# Patient Record
Sex: Female | Born: 1935 | Race: White | Hispanic: No | Marital: Married | Smoking: Never smoker
Health system: Southern US, Community
[De-identification: ages and names within clinical notes are randomized; demographics above are authoritative.]

## PROBLEM LIST (undated history)

## (undated) DIAGNOSIS — I10 Essential (primary) hypertension: Secondary | ICD-10-CM

## (undated) DIAGNOSIS — Z950 Presence of cardiac pacemaker: Secondary | ICD-10-CM

## (undated) HISTORY — PX: ABDOMINAL HYSTERECTOMY: SHX81

## (undated) HISTORY — DX: Essential (primary) hypertension: I10

## (undated) HISTORY — DX: Presence of cardiac pacemaker: Z95.0

## (undated) HISTORY — PX: BREAST SURGERY: SHX581

---

## 2010-11-20 IMAGING — US US EXTREM LOW VENOUS*R*
1 series · 17 of 24 positions shown · non-contrast
Comparison: none

REASON FOR EXAM: swollen right leg    eval DVT  call report [phone]
COMMENTS:

[Series 1: us extrem low venous*right* · 17 of 32 slices shown]
[im 1/32]
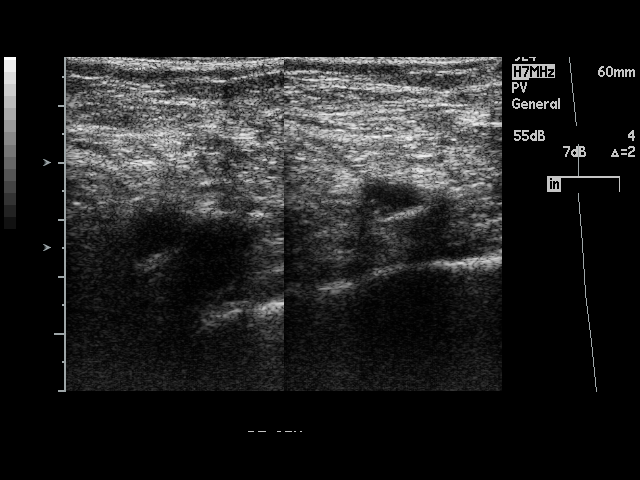
[im 3/32]
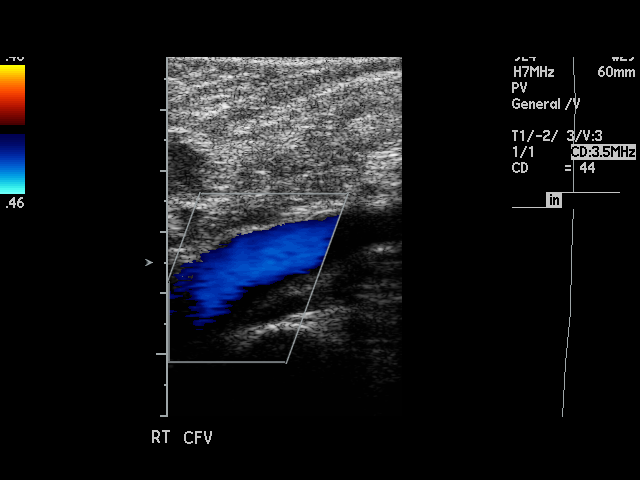
[im 5/32]
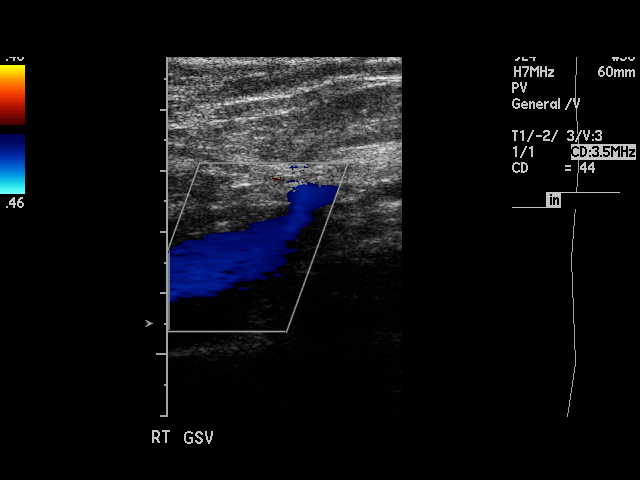
[im 6/32]
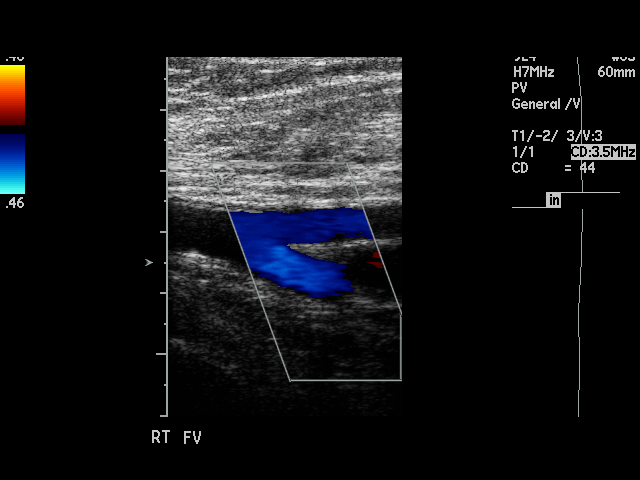
[im 9/32]
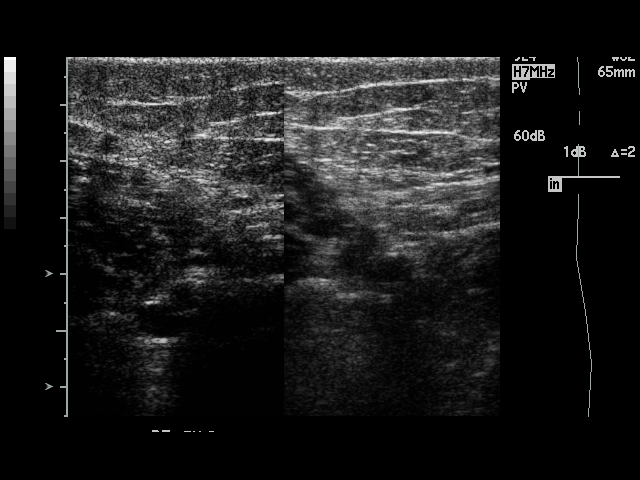
[im 10/32]
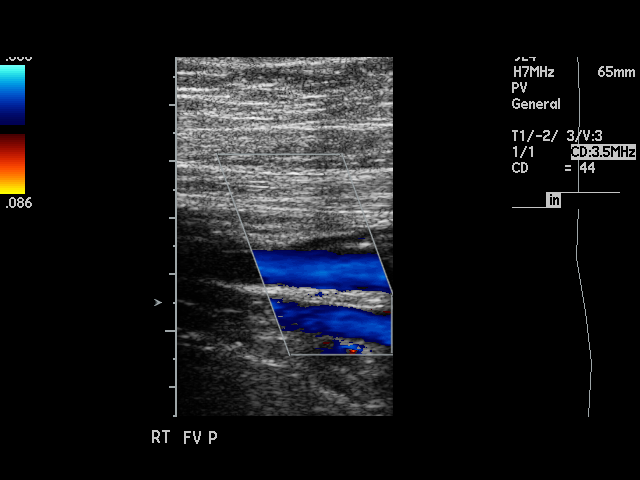
[im 13/32]
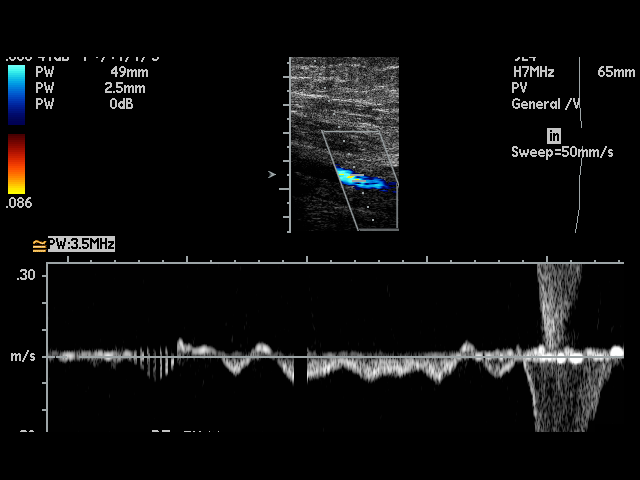
[im 14/32]
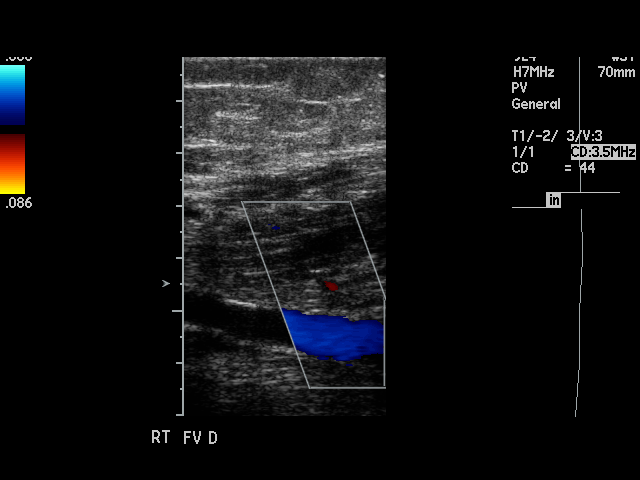
[im 17/32]
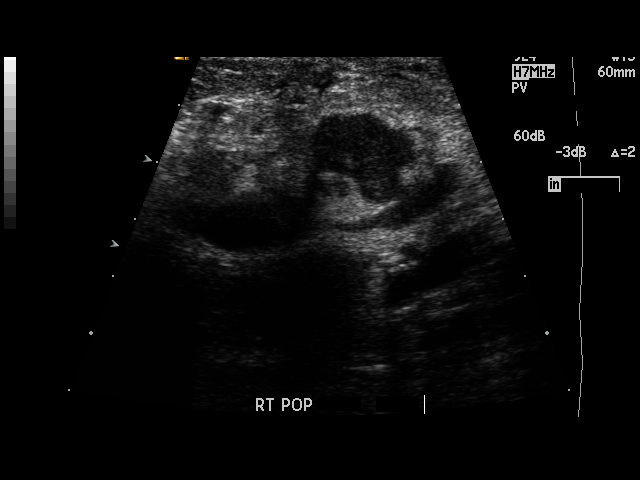
[im 18/32]
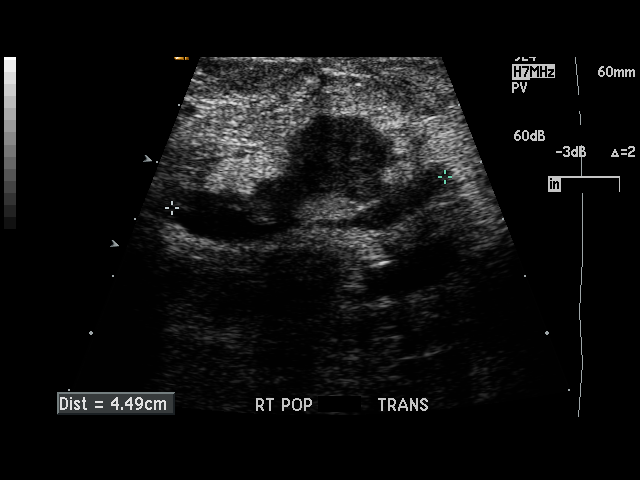
[im 19/32]
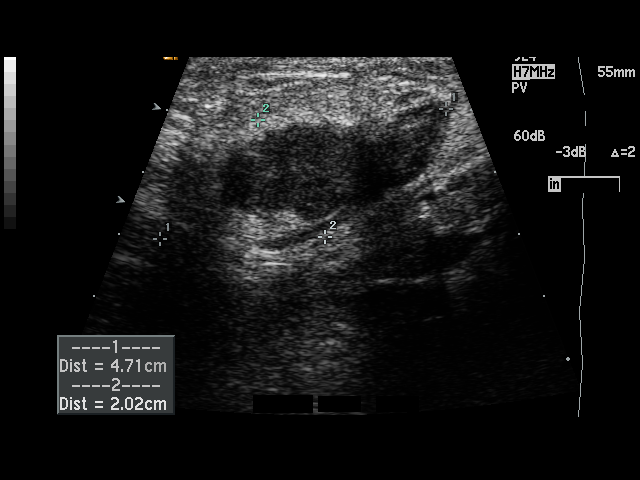
[im 22/32]
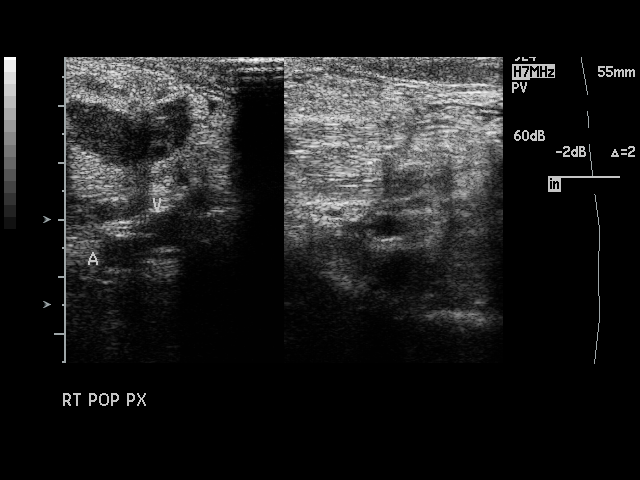
[im 23/32]
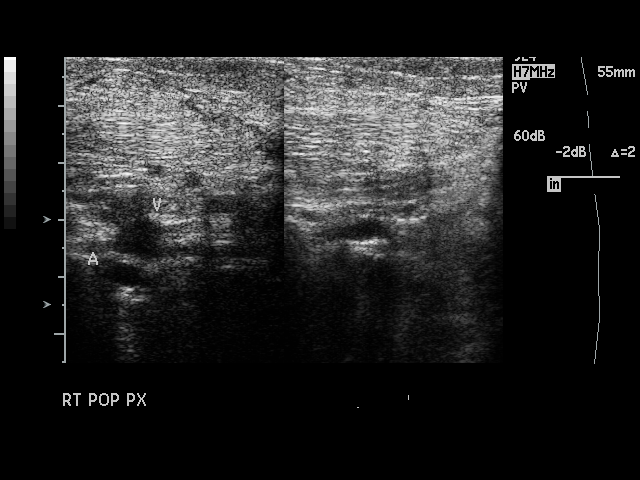
[im 26/32]
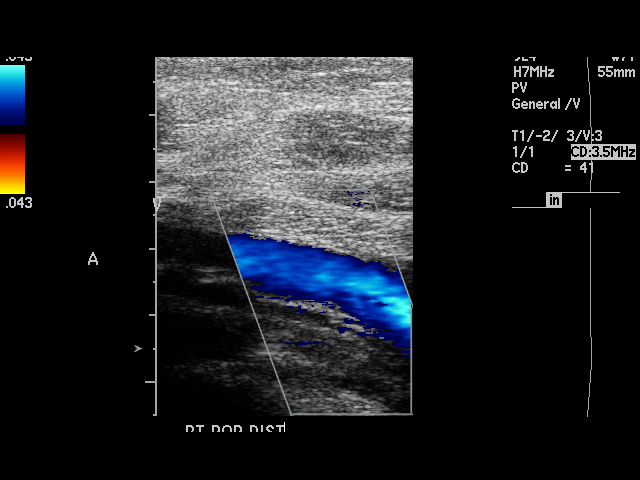
[im 27/32]
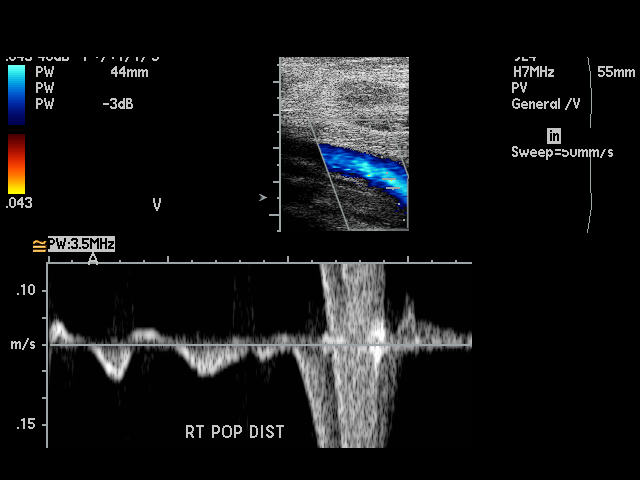
[im 29/32]
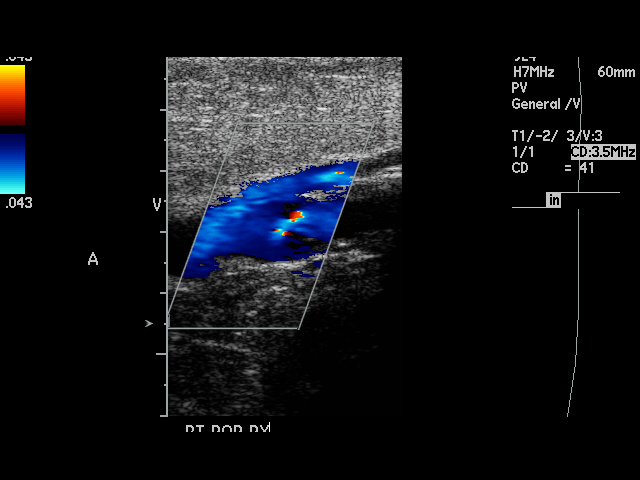
[im 32/32]
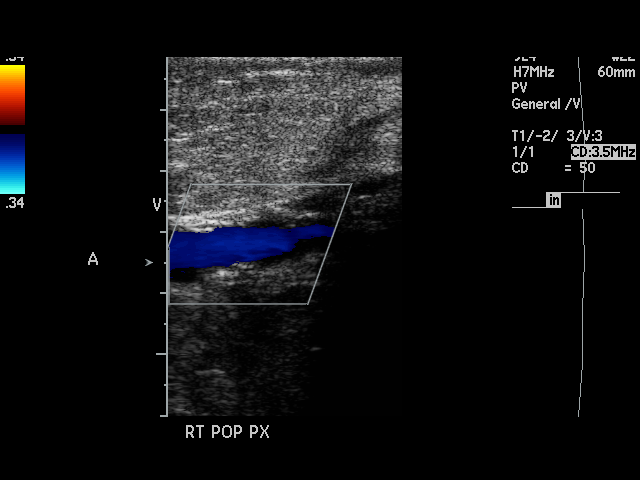

[17 of 24 positions shown; findings below may reference images not displayed]

PROCEDURE:     US  - US DOPPLER LOW EXTR RIGHT  - May 24, 2008  [DATE]

RESULT:     The augmentation flow waveforms are normal. The RIGHT
femoral/popliteal vein shows complete compressibility throughout its course.
Doppler examination shows no occlusion or evidence of the deep venous
thrombosis. There is incidentally noted are a few nonspecific flecks of
calcification in the popliteal vein. There is lucency at the knee
posteriorly compatible with a popliteal cyst. This contains internal echoes
consistent with debris. The popliteal cyst measures 4.49 cm x 4.71 cm x
cm.
IMPRESSION: 1. No deep vein thrombosis is identified.
2. There is a fluid collection posterior to the knee compatible with a
popliteal cyst containing debris.
3. Incidental note is made of a few flecks of calcification in the popliteal
vein.

## 2011-01-22 ENCOUNTER — Other Ambulatory Visit: Payer: Self-pay

## 2011-01-22 ENCOUNTER — Other Ambulatory Visit (INDEPENDENT_AMBULATORY_CARE_PROVIDER_SITE_OTHER): Payer: Medicare Other | Admitting: *Deleted

## 2011-01-22 DIAGNOSIS — Z7901 Long term (current) use of anticoagulants: Secondary | ICD-10-CM

## 2011-01-22 LAB — PROTIME-INR: INR: 4.5 ratio — ABNORMAL HIGH (ref 0.8–1.0)

## 2011-01-24 ENCOUNTER — Other Ambulatory Visit (INDEPENDENT_AMBULATORY_CARE_PROVIDER_SITE_OTHER): Payer: Medicare Other | Admitting: *Deleted

## 2011-01-24 DIAGNOSIS — Z7901 Long term (current) use of anticoagulants: Secondary | ICD-10-CM

## 2011-01-24 NOTE — Progress Notes (Signed)
Addended by: Jobie Quaker on: 01/24/2011 12:09 PM   Modules accepted: Orders

## 2011-01-26 ENCOUNTER — Other Ambulatory Visit: Payer: Self-pay | Admitting: Internal Medicine

## 2011-01-26 DIAGNOSIS — Z9229 Personal history of other drug therapy: Secondary | ICD-10-CM

## 2011-01-26 DIAGNOSIS — I10 Essential (primary) hypertension: Secondary | ICD-10-CM

## 2011-01-26 MED ORDER — WARFARIN SODIUM 5 MG PO TABS: 5.0000 mg | ORAL_TABLET | Freq: Every day | ORAL | Status: DC

## 2011-01-26 MED ORDER — HYDROCHLOROTHIAZIDE 25 MG PO TABS: 25.0000 mg | ORAL_TABLET | Freq: Every day | ORAL | Status: DC

## 2011-01-26 MED ORDER — AMLODIPINE BESYLATE 10 MG PO TABS: 10.0000 mg | ORAL_TABLET | Freq: Every day | ORAL | Status: DC

## 2011-01-30 ENCOUNTER — Ambulatory Visit: Payer: Medicare Other | Admitting: Internal Medicine

## 2011-01-30 LAB — PROTIME-INR
INR: 1.2 ratio — ABNORMAL HIGH (ref 0.8–1.0)
Prothrombin Time: 13.6 s — ABNORMAL HIGH (ref 10.2–12.4)

## 2011-02-01 ENCOUNTER — Encounter: Payer: Self-pay | Admitting: Internal Medicine

## 2011-02-01 ENCOUNTER — Ambulatory Visit (INDEPENDENT_AMBULATORY_CARE_PROVIDER_SITE_OTHER): Payer: Medicare Other | Admitting: Internal Medicine

## 2011-02-01 VITALS — BP 137/67 | HR 77 | Temp 98.2°F | Resp 12 | Ht 62.0 in | Wt 146.0 lb

## 2011-02-01 DIAGNOSIS — Z7901 Long term (current) use of anticoagulants: Secondary | ICD-10-CM

## 2011-02-01 DIAGNOSIS — I10 Essential (primary) hypertension: Secondary | ICD-10-CM

## 2011-02-01 MED ORDER — VALSARTAN 320 MG PO TABS: 320.0000 mg | ORAL_TABLET | Freq: Every day | ORAL | Status: DC

## 2011-02-01 NOTE — Patient Instructions (Signed)
Take 6mg  of coumadin on Monday, Wednesday, Friday, and Sunday. Take 5mg  on Tuesday, Thursday, Saturday. Repeat coumadin level in 1 week.

## 2011-02-01 NOTE — Progress Notes (Signed)
Subjective:    Patient ID: Beverly Powell, female    DOB: 1919-09-21, 77 y.o.   MRN: [id number]  HPI Beverly Powell is an 77 year old female with a history of hypertension and atrial fibrillation on chronic anticoagulation with Coumadin. She presents for followup today. She has been doing very well and denies any complaints today. She denies any easy bleeding or bruising. Her recent INR this week was noted to be low and her dose of Coumadin was increased.  Outpatient Encounter Prescriptions as of 02/01/2011  Medication Sig Dispense Refill  . amLODipine (NORVASC) 10 MG tablet Take 1 tablet (10 mg total) by mouth daily.  30 tablet  11  . aspirin 81 MG tablet Take 81 mg by mouth daily.        . Calcium Carbonate (CALCIUM 600 PO) Take 1 tablet by mouth daily.        . Cholecalciferol (VITAMIN D3) 400 UNITS CAPS Take 1 tablet by mouth daily.        . furosemide (LASIX) 20 MG tablet Take 20 mg by mouth daily.        . hydrochlorothiazide 25 MG tablet Take 1 tablet (25 mg total) by mouth daily.  30 tablet  6  . metoprolol tartrate (LOPRESSOR) 25 MG tablet Take by mouth daily.        Marland Kitchen oxybutynin (DITROPAN-XL) 5 MG 24 hr tablet Take 5 mg by mouth daily.        . valsartan (DIOVAN) 320 MG tablet Take 1 tablet (320 mg total) by mouth daily.  90 tablet  4  . warfarin (COUMADIN) 1 MG tablet Take 1 mg by mouth as directed. Take 6.5mg  on Monday, Wednesday and Friday and sunday       . warfarin (COUMADIN) 5 MG tablet Take 5 mg by mouth. On Tuesday, Thursday and Saturday          Review of Systems  Constitutional: Negative for fever, chills, appetite change, fatigue and unexpected weight change.  HENT: Negative for ear pain, congestion, sore throat, trouble swallowing, neck pain, voice change and sinus pressure.   Eyes: Negative for visual disturbance.  Respiratory: Negative for cough, shortness of breath, wheezing and stridor.   Cardiovascular: Negative for chest pain, palpitations and leg swelling.    Gastrointestinal: Negative for nausea, vomiting, abdominal pain, diarrhea, constipation, blood in stool, abdominal distention and anal bleeding.  Genitourinary: Negative for dysuria and flank pain.  Musculoskeletal: Negative for myalgias, arthralgias and gait problem.  Skin: Negative for color change and rash.  Neurological: Negative for dizziness and headaches.  Hematological: Negative for adenopathy. Does not bruise/bleed easily.  Psychiatric/Behavioral: Negative for suicidal ideas, sleep disturbance and dysphoric mood. The patient is not nervous/anxious.     BP 137/67  Pulse 77  Temp(Src) 98.2 F (36.8 C) (Oral)  Resp 12  Ht 5\' 2"  (1.575 m)  Wt 146 lb (66.225 kg)  BMI 26.70 kg/m2  SpO2 100%     Objective:   Physical Exam  Constitutional: She is oriented to person, place, and time. She appears well-developed and well-nourished. No distress.  HENT:  Head: Normocephalic and atraumatic.  Right Ear: External ear normal.  Left Ear: External ear normal.  Nose: Nose normal.  Mouth/Throat: Oropharynx is clear and moist. No oropharyngeal exudate.  Eyes: Conjunctivae are normal. Pupils are equal, round, and reactive to light. Right eye exhibits no discharge. Left eye exhibits no discharge. No scleral icterus.  Neck: Normal range of motion. Neck supple. No tracheal deviation present. No  thyromegaly present.  Cardiovascular: Normal rate, regular rhythm, normal heart sounds and intact distal pulses.  Exam reveals no gallop and no friction rub.   No murmur heard. Pulmonary/Chest: Effort normal and breath sounds normal. No respiratory distress. She has no wheezes. She has no rales. She exhibits no tenderness.  Musculoskeletal: Normal range of motion. She exhibits no edema and no tenderness.  Lymphadenopathy:    She has no cervical adenopathy.  Neurological: She is alert and oriented to person, place, and time. No cranial nerve deficit. She exhibits normal muscle tone. Coordination normal.   Skin: Skin is warm and dry. No rash noted. She is not diaphoretic. No erythema. No pallor.  Psychiatric: She has a normal mood and affect. Her behavior is normal. Judgment and thought content normal.          Assessment & Plan:  1. Hypertension - patient with hypertension currently on amlodipine, Diovan, furosemide, metoprolol, and hydrochlorothiazide. Blood pressure is well-controlled today. Will recheck renal function with labs next week. She will return to clinic in 3 months.  2. Chronic anticoagulation - patient on Coumadin for anticoagulation recent INR was low at 1.2. Goal INR 2-3. She has increased her Coumadin dose as instructed. We will repeat INR in one week.

## 2011-02-02 ENCOUNTER — Ambulatory Visit: Payer: Medicare Other | Admitting: Internal Medicine

## 2011-02-05 ENCOUNTER — Telehealth: Payer: Self-pay | Admitting: Internal Medicine

## 2011-02-05 NOTE — Telephone Encounter (Signed)
[employer] is requesting a list of patient's current meds, when & why she takes them & ICD code. Please contact patient's daughter

## 2011-02-07 ENCOUNTER — Other Ambulatory Visit (INDEPENDENT_AMBULATORY_CARE_PROVIDER_SITE_OTHER): Payer: Medicare Other | Admitting: *Deleted

## 2011-02-07 DIAGNOSIS — Z7901 Long term (current) use of anticoagulants: Secondary | ICD-10-CM

## 2011-02-07 LAB — PROTIME-INR: Prothrombin Time: 29.1 s — ABNORMAL HIGH (ref 10.2–12.4)

## 2011-02-12 NOTE — Telephone Encounter (Signed)
Printed out medication list and why she takes them and the ICD-9 codes.  Left medication list with Zella Ball for her daughter to pick up.

## 2011-02-14 ENCOUNTER — Telehealth: Payer: Self-pay | Admitting: Internal Medicine

## 2011-02-14 NOTE — Telephone Encounter (Signed)
Has some questions about the form she picked up yesterday.  About her meds

## 2011-02-16 ENCOUNTER — Other Ambulatory Visit: Payer: Self-pay | Admitting: Internal Medicine

## 2011-02-16 ENCOUNTER — Other Ambulatory Visit (INDEPENDENT_AMBULATORY_CARE_PROVIDER_SITE_OTHER): Payer: Medicare Other | Admitting: *Deleted

## 2011-02-16 DIAGNOSIS — Z7901 Long term (current) use of anticoagulants: Secondary | ICD-10-CM

## 2011-02-16 DIAGNOSIS — I10 Essential (primary) hypertension: Secondary | ICD-10-CM

## 2011-02-16 LAB — COMPREHENSIVE METABOLIC PANEL
AST: 26 U/L (ref 0–37)
Alkaline Phosphatase: 45 U/L (ref 39–117)
BUN: 40 mg/dL — ABNORMAL HIGH (ref 6–23)
Creatinine, Ser: 1.5 mg/dL — ABNORMAL HIGH (ref 0.4–1.2)
Glucose, Bld: 85 mg/dL (ref 70–99)

## 2011-02-16 LAB — PROTIME-INR: Prothrombin Time: 39.7 s — ABNORMAL HIGH (ref 10.2–12.4)

## 2011-02-16 LAB — TSH: TSH: 0.93 u[IU]/mL (ref 0.35–5.50)

## 2011-02-16 MED ORDER — FUROSEMIDE 20 MG PO TABS: 20.0000 mg | ORAL_TABLET | Freq: Every day | ORAL | Status: DC

## 2011-02-16 NOTE — Telephone Encounter (Signed)
Ms Joanne Gavel called for her mother.  Ms fadness went by [facility] to pick up med the pharmacy told ms Arabie the rx was denied.  She didn't know who denied it insurance of dr office.  The name of the drugs was oxybutynin er 5mg  tablet Furosemide 20mg    Please advise  Pt when rx is ready

## 2011-02-16 NOTE — Telephone Encounter (Signed)
See below.  Pt daughter called drug store they faxed to wrong dr office. The drug store will be faxing rx to [place].  Pt is out of both meds

## 2011-02-19 ENCOUNTER — Other Ambulatory Visit: Payer: Self-pay | Admitting: *Deleted

## 2011-02-19 MED ORDER — OXYBUTYNIN CHLORIDE ER 5 MG PO TB24: 5.0000 mg | ORAL_TABLET | Freq: Every day | ORAL | Status: DC

## 2011-02-19 NOTE — Telephone Encounter (Signed)
Rx's have been filled

## 2011-02-22 ENCOUNTER — Other Ambulatory Visit (INDEPENDENT_AMBULATORY_CARE_PROVIDER_SITE_OTHER): Payer: Medicare Other | Admitting: *Deleted

## 2011-02-22 DIAGNOSIS — Z7901 Long term (current) use of anticoagulants: Secondary | ICD-10-CM

## 2011-02-22 LAB — PROTIME-INR
INR: 1.6 — ABNORMAL HIGH (ref <1.50)
Prothrombin Time: 19.6 seconds — ABNORMAL HIGH (ref 11.6–15.2)

## 2011-03-05 ENCOUNTER — Other Ambulatory Visit (INDEPENDENT_AMBULATORY_CARE_PROVIDER_SITE_OTHER): Payer: Medicare Other | Admitting: *Deleted

## 2011-03-05 DIAGNOSIS — Z7901 Long term (current) use of anticoagulants: Secondary | ICD-10-CM

## 2011-03-20 ENCOUNTER — Telehealth: Payer: Self-pay | Admitting: Internal Medicine

## 2011-03-20 MED ORDER — OXYBUTYNIN CHLORIDE ER 5 MG PO TB24: 5.0000 mg | ORAL_TABLET | Freq: Every day | ORAL | Status: DC

## 2011-03-20 NOTE — Telephone Encounter (Signed)
I believe that she has completed 5 years of therapy with reclast and Dr. Gavin Potters did not want her to continue with it.  We should get records from [facility] to confirm.

## 2011-03-20 NOTE — Telephone Encounter (Signed)
Patient received a letter from [facility] telling her it is time for another reclast infusion. She was told the last time she received one she would not receive anymore.What should she do?

## 2011-03-29 ENCOUNTER — Ambulatory Visit (INDEPENDENT_AMBULATORY_CARE_PROVIDER_SITE_OTHER): Payer: Medicare Other | Admitting: *Deleted

## 2011-03-29 ENCOUNTER — Other Ambulatory Visit (INDEPENDENT_AMBULATORY_CARE_PROVIDER_SITE_OTHER): Payer: Medicare Other | Admitting: *Deleted

## 2011-03-29 DIAGNOSIS — D649 Anemia, unspecified: Secondary | ICD-10-CM | POA: Insufficient documentation

## 2011-03-29 DIAGNOSIS — Z23 Encounter for immunization: Secondary | ICD-10-CM

## 2011-03-29 DIAGNOSIS — I279 Pulmonary heart disease, unspecified: Secondary | ICD-10-CM | POA: Insufficient documentation

## 2011-03-29 DIAGNOSIS — I4891 Unspecified atrial fibrillation: Secondary | ICD-10-CM | POA: Insufficient documentation

## 2011-03-29 DIAGNOSIS — I495 Sick sinus syndrome: Secondary | ICD-10-CM | POA: Insufficient documentation

## 2011-03-29 DIAGNOSIS — Z7901 Long term (current) use of anticoagulants: Secondary | ICD-10-CM

## 2011-03-29 LAB — PROTIME-INR: INR: 4.6 ratio — ABNORMAL HIGH (ref 0.8–1.0)

## 2011-04-05 ENCOUNTER — Other Ambulatory Visit (INDEPENDENT_AMBULATORY_CARE_PROVIDER_SITE_OTHER): Payer: Medicare Other | Admitting: *Deleted

## 2011-04-05 DIAGNOSIS — Z7901 Long term (current) use of anticoagulants: Secondary | ICD-10-CM

## 2011-04-05 LAB — PROTIME-INR: Prothrombin Time: 31.6 s — ABNORMAL HIGH (ref 10.2–12.4)

## 2011-04-06 ENCOUNTER — Other Ambulatory Visit: Payer: Medicare Other

## 2011-04-24 ENCOUNTER — Other Ambulatory Visit: Payer: Self-pay | Admitting: *Deleted

## 2011-04-24 MED ORDER — OXYBUTYNIN CHLORIDE ER 5 MG PO TB24: 5.0000 mg | ORAL_TABLET | Freq: Every day | ORAL | Status: DC

## 2011-04-25 ENCOUNTER — Other Ambulatory Visit: Payer: Self-pay | Admitting: *Deleted

## 2011-04-25 MED ORDER — OXYBUTYNIN CHLORIDE ER 5 MG PO TB24: 5.0000 mg | ORAL_TABLET | Freq: Every day | ORAL | Status: DC

## 2011-05-04 ENCOUNTER — Encounter: Payer: Self-pay | Admitting: Internal Medicine

## 2011-05-04 ENCOUNTER — Telehealth: Payer: Self-pay | Admitting: *Deleted

## 2011-05-04 ENCOUNTER — Ambulatory Visit (INDEPENDENT_AMBULATORY_CARE_PROVIDER_SITE_OTHER): Payer: Medicare Other | Admitting: Internal Medicine

## 2011-05-04 DIAGNOSIS — Z7901 Long term (current) use of anticoagulants: Secondary | ICD-10-CM

## 2011-05-04 DIAGNOSIS — I509 Heart failure, unspecified: Secondary | ICD-10-CM

## 2011-05-04 DIAGNOSIS — S51809A Unspecified open wound of unspecified forearm, initial encounter: Secondary | ICD-10-CM

## 2011-05-04 DIAGNOSIS — I10 Essential (primary) hypertension: Secondary | ICD-10-CM

## 2011-05-04 LAB — COMPREHENSIVE METABOLIC PANEL
AST: 26 U/L (ref 0–37)
Alkaline Phosphatase: 46 U/L (ref 39–117)
BUN: 45 mg/dL — ABNORMAL HIGH (ref 6–23)
Creatinine, Ser: 1.7 mg/dL — ABNORMAL HIGH (ref 0.4–1.2)
Total Bilirubin: 0.4 mg/dL (ref 0.3–1.2)

## 2011-05-04 LAB — PROTIME-INR: INR: 3.9 ratio — ABNORMAL HIGH (ref 0.8–1.0)

## 2011-05-04 NOTE — Telephone Encounter (Signed)
Message copied by Vernie Murders on Fri May 04, 2011  5:54 PM ------      Message from: Ronna Polio A      Created: Fri May 04, 2011  4:27 PM       INR was slightly high at 3.9. I would like to repeat in 1 week.      Her labs show that she is dehydrated. I would like her to go back to 20mg  of Lasix daily.

## 2011-05-04 NOTE — Progress Notes (Addendum)
Subjective:    Patient ID: Beverly Powell, female    DOB: 1919-12-03, 77 y.o.   MRN: [id number]  HPI 77 year old female with a history of hypertension, atrial fibrillation on chronic anticoagulation, and congestive heart failure presents for followup. She notes that over the last few weeks she had some increasing shortness of breath and increasing lower extremity edema. She was seen by her ENT physician who increased her dose of Lasix to 40 mg daily. She reports significant improvement in her breathing and lower extremity edema on this dose of Lasix. She denies any chest pain, shortness of breath, or palpitations.  In regards to her chronic anticoagulation, she reports full compliance with her Coumadin. She notes easy bruising but no recent bleeding. She notes that she recently bumped her left arm and developed a skin tear which she has been applying a bandage 2. She denies any induration around the wound. She denies any fever or chills. She denies any other complaints today.  Outpatient Encounter Prescriptions as of 05/04/2011  Medication Sig Dispense Refill  . amLODipine (NORVASC) 10 MG tablet Take 1 tablet (10 mg total) by mouth daily.  30 tablet  11  . aspirin 81 MG tablet Take 81 mg by mouth daily.        . Calcium Carbonate (CALCIUM 600 PO) Take 1 tablet by mouth daily.        . Cholecalciferol (VITAMIN D3) 400 UNITS CAPS Take 1 tablet by mouth daily.        . furosemide (LASIX) 20 MG tablet Take 40 mg by mouth daily.        . hydrochlorothiazide 25 MG tablet Take 1 tablet (25 mg total) by mouth daily.  30 tablet  6  . metoprolol tartrate (LOPRESSOR) 25 MG tablet Take by mouth daily.        Marland Kitchen oxybutynin (DITROPAN-XL) 5 MG 24 hr tablet Take 1 tablet (5 mg total) by mouth daily.  90 tablet  0  . valsartan (DIOVAN) 320 MG tablet Take 1 tablet (320 mg total) by mouth daily.  90 tablet  4  . warfarin (COUMADIN) 1 MG tablet Take 1 mg by mouth as directed. Take 6.5mg  on Monday, Wednesday and Friday  and sunday       . warfarin (COUMADIN) 5 MG tablet Take 5 mg by mouth. On Tuesday, Thursday and Saturday          Review of Systems  Constitutional: Negative for fever, chills, appetite change, fatigue and unexpected weight change.  HENT: Negative for ear pain, congestion, sore throat, trouble swallowing, neck pain, voice change and sinus pressure.   Eyes: Negative for visual disturbance.  Respiratory: Negative for cough, shortness of breath, wheezing and stridor.   Cardiovascular: Positive for leg swelling. Negative for chest pain and palpitations.  Gastrointestinal: Negative for nausea, vomiting, abdominal pain, diarrhea, constipation, blood in stool, abdominal distention and anal bleeding.  Genitourinary: Negative for dysuria and flank pain.  Musculoskeletal: Negative for myalgias, arthralgias and gait problem.  Skin: Positive for wound. Negative for color change and rash.  Neurological: Negative for dizziness and headaches.  Hematological: Negative for adenopathy. Does not bruise/bleed easily.  Psychiatric/Behavioral: Negative for suicidal ideas, sleep disturbance and dysphoric mood. The patient is not nervous/anxious.    BP 114/78  Pulse 100  Temp(Src) 98.2 F (36.8 C) (Oral)  Wt 145 lb (65.772 kg)  SpO2 94%     Objective:   Physical Exam  Constitutional: She is oriented to person, place, and time. She  appears well-developed and well-nourished. No distress.  HENT:  Head: Normocephalic and atraumatic.  Right Ear: External ear normal.  Left Ear: External ear normal.  Nose: Nose normal.  Mouth/Throat: Oropharynx is clear and moist. No oropharyngeal exudate.  Eyes: Conjunctivae are normal. Pupils are equal, round, and reactive to light. Right eye exhibits no discharge. Left eye exhibits no discharge. No scleral icterus.  Neck: Normal range of motion. Neck supple. No tracheal deviation present. No thyromegaly present.  Cardiovascular: Normal rate, regular rhythm, normal heart  sounds and intact distal pulses.  Exam reveals no gallop and no friction rub.   No murmur heard. Pulmonary/Chest: Effort normal and breath sounds normal. No respiratory distress. She has no wheezes. She has no rales. She exhibits no tenderness.  Musculoskeletal: Normal range of motion. She exhibits no edema and no tenderness.  Lymphadenopathy:    She has no cervical adenopathy.  Neurological: She is alert and oriented to person, place, and time. No cranial nerve deficit. She exhibits normal muscle tone. Coordination normal.  Skin: Skin is warm and dry. No rash noted. She is not diaphoretic. No erythema. No pallor.     Psychiatric: She has a normal mood and affect. Her behavior is normal. Judgment and thought content normal.          Assessment & Plan:  1. Congestive heart failure - need to get reports from [facility] asked her last echo. We'll continue current medications. Will leave Lasix at 40 mg daily. Will recheck kidney function and electrolytes today. She will followup in 3 months.  2. Chronic anticoagulation secondary to atrial fibrillation- will check INR with labs today.Goal INR 2-3.  3. Skin tear - area was covered with Tegaderm. Patient will call if any increasing erythema or pain.   Addendum: Reviewed notes from [facility]. ECHO 2010 showed normal EF and no diastolic dysfunction. Will need to repeat this given recent increased fluid overload and pulmonary edema. Will discuss at next visit or earlier if symptoms recur.

## 2011-05-04 NOTE — Telephone Encounter (Signed)
Med list updated

## 2011-05-10 ENCOUNTER — Other Ambulatory Visit (INDEPENDENT_AMBULATORY_CARE_PROVIDER_SITE_OTHER): Payer: Medicare Other | Admitting: *Deleted

## 2011-05-10 DIAGNOSIS — Z7901 Long term (current) use of anticoagulants: Secondary | ICD-10-CM

## 2011-05-10 LAB — PROTIME-INR: Prothrombin Time: 36.6 s — ABNORMAL HIGH (ref 10.2–12.4)

## 2011-06-07 DIAGNOSIS — Z950 Presence of cardiac pacemaker: Secondary | ICD-10-CM | POA: Insufficient documentation

## 2011-06-14 ENCOUNTER — Other Ambulatory Visit: Payer: Medicare Other

## 2011-06-21 ENCOUNTER — Other Ambulatory Visit (INDEPENDENT_AMBULATORY_CARE_PROVIDER_SITE_OTHER): Payer: Medicare Other | Admitting: *Deleted

## 2011-06-21 DIAGNOSIS — Z7901 Long term (current) use of anticoagulants: Secondary | ICD-10-CM

## 2011-06-21 LAB — PROTIME-INR
INR: 2.1 ratio — ABNORMAL HIGH (ref 0.8–1.0)
Prothrombin Time: 23.6 s — ABNORMAL HIGH (ref 10.2–12.4)

## 2011-07-19 ENCOUNTER — Other Ambulatory Visit (INDEPENDENT_AMBULATORY_CARE_PROVIDER_SITE_OTHER): Payer: Medicare Other | Admitting: *Deleted

## 2011-07-19 DIAGNOSIS — Z7901 Long term (current) use of anticoagulants: Secondary | ICD-10-CM

## 2011-07-20 LAB — PROTIME-INR
INR: 3.46 — ABNORMAL HIGH
Prothrombin Time: 35.9 s — ABNORMAL HIGH (ref 11.6–15.2)

## 2011-07-26 ENCOUNTER — Other Ambulatory Visit (INDEPENDENT_AMBULATORY_CARE_PROVIDER_SITE_OTHER): Payer: Medicare Other | Admitting: *Deleted

## 2011-07-26 ENCOUNTER — Telehealth: Payer: Self-pay | Admitting: *Deleted

## 2011-07-26 DIAGNOSIS — Z7901 Long term (current) use of anticoagulants: Secondary | ICD-10-CM

## 2011-07-26 LAB — PROTIME-INR: Prothrombin Time: 37 s — ABNORMAL HIGH (ref 10.2–12.4)

## 2011-07-26 MED ORDER — WARFARIN SODIUM 4 MG PO TABS: 4.0000 mg | ORAL_TABLET | Freq: Every day | ORAL | Status: DC

## 2011-07-26 NOTE — Telephone Encounter (Signed)
Rx sent to pharmacy   

## 2011-08-02 ENCOUNTER — Other Ambulatory Visit (INDEPENDENT_AMBULATORY_CARE_PROVIDER_SITE_OTHER): Payer: Medicare Other | Admitting: *Deleted

## 2011-08-02 DIAGNOSIS — Z7901 Long term (current) use of anticoagulants: Secondary | ICD-10-CM

## 2011-08-23 ENCOUNTER — Other Ambulatory Visit (INDEPENDENT_AMBULATORY_CARE_PROVIDER_SITE_OTHER): Payer: Medicare Other | Admitting: *Deleted

## 2011-08-23 DIAGNOSIS — Z7901 Long term (current) use of anticoagulants: Secondary | ICD-10-CM

## 2011-08-28 ENCOUNTER — Other Ambulatory Visit: Payer: Self-pay | Admitting: *Deleted

## 2011-08-28 MED ORDER — OXYBUTYNIN CHLORIDE ER 5 MG PO TB24: 5.0000 mg | ORAL_TABLET | Freq: Every day | ORAL | Status: DC

## 2011-08-31 ENCOUNTER — Other Ambulatory Visit (INDEPENDENT_AMBULATORY_CARE_PROVIDER_SITE_OTHER): Payer: Medicare Other

## 2011-08-31 DIAGNOSIS — Z7901 Long term (current) use of anticoagulants: Secondary | ICD-10-CM

## 2011-08-31 LAB — PROTIME-INR: INR: 2.3 ratio — ABNORMAL HIGH (ref 0.8–1.0)

## 2011-09-06 ENCOUNTER — Other Ambulatory Visit: Payer: Self-pay | Admitting: *Deleted

## 2011-09-06 DIAGNOSIS — I10 Essential (primary) hypertension: Secondary | ICD-10-CM

## 2011-09-06 MED ORDER — HYDROCHLOROTHIAZIDE 25 MG PO TABS: 25.0000 mg | ORAL_TABLET | Freq: Every day | ORAL | Status: DC

## 2011-09-19 ENCOUNTER — Other Ambulatory Visit: Payer: Self-pay | Admitting: Internal Medicine

## 2011-09-19 MED ORDER — FUROSEMIDE 20 MG PO TABS: 20.0000 mg | ORAL_TABLET | Freq: Every day | ORAL | Status: DC

## 2011-09-27 ENCOUNTER — Other Ambulatory Visit (INDEPENDENT_AMBULATORY_CARE_PROVIDER_SITE_OTHER): Payer: Medicare Other | Admitting: *Deleted

## 2011-09-27 DIAGNOSIS — Z7901 Long term (current) use of anticoagulants: Secondary | ICD-10-CM

## 2011-09-27 LAB — PROTIME-INR: Prothrombin Time: 25.7 s — ABNORMAL HIGH (ref 10.2–12.4)

## 2011-10-25 ENCOUNTER — Other Ambulatory Visit (INDEPENDENT_AMBULATORY_CARE_PROVIDER_SITE_OTHER): Payer: Medicare Other | Admitting: *Deleted

## 2011-10-25 DIAGNOSIS — Z7901 Long term (current) use of anticoagulants: Secondary | ICD-10-CM

## 2011-10-25 LAB — PROTIME-INR: Prothrombin Time: 17.2 s — ABNORMAL HIGH (ref 10.2–12.4)

## 2011-10-25 NOTE — Progress Notes (Signed)
Addended by: Melody Comas L on: 10/25/2011 11:27 AM   Modules accepted: Orders

## 2011-11-08 ENCOUNTER — Other Ambulatory Visit (INDEPENDENT_AMBULATORY_CARE_PROVIDER_SITE_OTHER): Payer: Medicare Other | Admitting: *Deleted

## 2011-11-08 DIAGNOSIS — Z7901 Long term (current) use of anticoagulants: Secondary | ICD-10-CM

## 2011-11-08 LAB — PROTIME-INR: INR: 1.8 ratio — ABNORMAL HIGH (ref 0.8–1.0)

## 2011-11-15 ENCOUNTER — Other Ambulatory Visit (INDEPENDENT_AMBULATORY_CARE_PROVIDER_SITE_OTHER): Payer: Medicare Other | Admitting: *Deleted

## 2011-11-15 DIAGNOSIS — Z7901 Long term (current) use of anticoagulants: Secondary | ICD-10-CM

## 2011-12-20 ENCOUNTER — Other Ambulatory Visit (INDEPENDENT_AMBULATORY_CARE_PROVIDER_SITE_OTHER): Payer: Medicare Other | Admitting: *Deleted

## 2011-12-20 DIAGNOSIS — Z7901 Long term (current) use of anticoagulants: Secondary | ICD-10-CM

## 2011-12-21 LAB — PROTIME-INR
INR: 2.29 — ABNORMAL HIGH (ref <1.50)
Prothrombin Time: 26 seconds — ABNORMAL HIGH (ref 11.6–15.2)

## 2012-01-22 ENCOUNTER — Other Ambulatory Visit: Payer: Self-pay | Admitting: *Deleted

## 2012-01-22 DIAGNOSIS — I10 Essential (primary) hypertension: Secondary | ICD-10-CM

## 2012-01-22 MED ORDER — AMLODIPINE BESYLATE 10 MG PO TABS: 10.0000 mg | ORAL_TABLET | Freq: Every day | ORAL | Status: DC

## 2012-01-24 ENCOUNTER — Ambulatory Visit: Payer: Medicare Other

## 2012-01-24 ENCOUNTER — Other Ambulatory Visit (INDEPENDENT_AMBULATORY_CARE_PROVIDER_SITE_OTHER): Payer: Medicare Other | Admitting: *Deleted

## 2012-01-24 DIAGNOSIS — Z7901 Long term (current) use of anticoagulants: Secondary | ICD-10-CM

## 2012-01-24 LAB — PROTIME-INR
INR: 3.18 — ABNORMAL HIGH (ref <1.50)
Prothrombin Time: 33.6 seconds — ABNORMAL HIGH (ref 11.6–15.2)

## 2012-01-31 ENCOUNTER — Other Ambulatory Visit (INDEPENDENT_AMBULATORY_CARE_PROVIDER_SITE_OTHER): Payer: Medicare Other | Admitting: *Deleted

## 2012-01-31 DIAGNOSIS — Z7901 Long term (current) use of anticoagulants: Secondary | ICD-10-CM

## 2012-01-31 LAB — PROTIME-INR
INR: 3 ratio — ABNORMAL HIGH (ref 0.8–1.0)
Prothrombin Time: 33.2 s — ABNORMAL HIGH (ref 10.2–12.4)

## 2012-02-11 ENCOUNTER — Other Ambulatory Visit: Payer: Self-pay | Admitting: Internal Medicine

## 2012-02-11 DIAGNOSIS — I10 Essential (primary) hypertension: Secondary | ICD-10-CM

## 2012-02-11 MED ORDER — VALSARTAN 320 MG PO TABS: 320.0000 mg | ORAL_TABLET | Freq: Every day | ORAL | Status: DC

## 2012-02-26 ENCOUNTER — Other Ambulatory Visit: Payer: Self-pay | Admitting: Internal Medicine

## 2012-02-26 NOTE — Telephone Encounter (Signed)
Refill request Oxybutynin er 5 mg tablest  Sig: take 1 tablet by mouth every day

## 2012-02-27 MED ORDER — OXYBUTYNIN CHLORIDE ER 5 MG PO TB24: 5.0000 mg | ORAL_TABLET | Freq: Every day | ORAL | Status: DC

## 2012-02-27 NOTE — Telephone Encounter (Signed)
rx sent to pharmacy by e-script  

## 2012-02-29 ENCOUNTER — Other Ambulatory Visit (INDEPENDENT_AMBULATORY_CARE_PROVIDER_SITE_OTHER): Payer: Medicare Other

## 2012-02-29 DIAGNOSIS — I509 Heart failure, unspecified: Secondary | ICD-10-CM

## 2012-02-29 DIAGNOSIS — Z79899 Other long term (current) drug therapy: Secondary | ICD-10-CM

## 2012-02-29 DIAGNOSIS — I10 Essential (primary) hypertension: Secondary | ICD-10-CM

## 2012-03-13 ENCOUNTER — Other Ambulatory Visit: Payer: Self-pay | Admitting: *Deleted

## 2012-03-13 ENCOUNTER — Other Ambulatory Visit: Payer: Self-pay

## 2012-03-13 ENCOUNTER — Other Ambulatory Visit (INDEPENDENT_AMBULATORY_CARE_PROVIDER_SITE_OTHER): Payer: Medicare Other

## 2012-03-13 DIAGNOSIS — I10 Essential (primary) hypertension: Secondary | ICD-10-CM

## 2012-03-13 DIAGNOSIS — I509 Heart failure, unspecified: Secondary | ICD-10-CM

## 2012-03-13 DIAGNOSIS — Z79899 Other long term (current) drug therapy: Secondary | ICD-10-CM

## 2012-03-13 LAB — PROTIME-INR
INR: 3.6 ratio — ABNORMAL HIGH (ref 0.8–1.0)
Prothrombin Time: 37 s — ABNORMAL HIGH (ref 10.2–12.4)

## 2012-03-13 MED ORDER — WARFARIN SODIUM 5 MG PO TABS: 5.0000 mg | ORAL_TABLET | Freq: Every day | ORAL | Status: DC

## 2012-03-13 MED ORDER — WARFARIN SODIUM 4 MG PO TABS: 4.0000 mg | ORAL_TABLET | Freq: Every day | ORAL | Status: DC

## 2012-03-13 NOTE — Telephone Encounter (Signed)
error 

## 2012-03-13 NOTE — Telephone Encounter (Signed)
R'cd fax from [facility] for refill of Coumadin

## 2012-03-20 ENCOUNTER — Other Ambulatory Visit (INDEPENDENT_AMBULATORY_CARE_PROVIDER_SITE_OTHER): Payer: Medicare Other

## 2012-03-20 DIAGNOSIS — Z79899 Other long term (current) drug therapy: Secondary | ICD-10-CM

## 2012-03-20 DIAGNOSIS — I509 Heart failure, unspecified: Secondary | ICD-10-CM

## 2012-03-20 DIAGNOSIS — I10 Essential (primary) hypertension: Secondary | ICD-10-CM

## 2012-03-21 ENCOUNTER — Telehealth: Payer: Self-pay | Admitting: General Practice

## 2012-03-21 NOTE — Telephone Encounter (Signed)
Reported INR results and dosage information to patient.  Instructed patient to be checked in 1 month.

## 2012-04-04 ENCOUNTER — Other Ambulatory Visit: Payer: Self-pay | Admitting: *Deleted

## 2012-04-04 DIAGNOSIS — I10 Essential (primary) hypertension: Secondary | ICD-10-CM

## 2012-04-04 MED ORDER — HYDROCHLOROTHIAZIDE 25 MG PO TABS: 25.0000 mg | ORAL_TABLET | Freq: Every day | ORAL | Status: DC

## 2012-04-04 MED ORDER — FUROSEMIDE 20 MG PO TABS: 20.0000 mg | ORAL_TABLET | Freq: Every day | ORAL | Status: DC

## 2012-04-17 ENCOUNTER — Other Ambulatory Visit (INDEPENDENT_AMBULATORY_CARE_PROVIDER_SITE_OTHER): Payer: Medicare Other

## 2012-04-17 DIAGNOSIS — Z0189 Encounter for other specified special examinations: Secondary | ICD-10-CM

## 2012-04-17 DIAGNOSIS — Z79899 Other long term (current) drug therapy: Secondary | ICD-10-CM

## 2012-04-17 DIAGNOSIS — Z7901 Long term (current) use of anticoagulants: Secondary | ICD-10-CM

## 2012-05-27 ENCOUNTER — Other Ambulatory Visit: Payer: Self-pay | Admitting: *Deleted

## 2012-05-27 ENCOUNTER — Telehealth: Payer: Self-pay | Admitting: *Deleted

## 2012-05-27 DIAGNOSIS — Z7901 Long term (current) use of anticoagulants: Secondary | ICD-10-CM

## 2012-05-27 DIAGNOSIS — Z0000 Encounter for general adult medical examination without abnormal findings: Secondary | ICD-10-CM

## 2012-05-27 DIAGNOSIS — E785 Hyperlipidemia, unspecified: Secondary | ICD-10-CM

## 2012-05-27 NOTE — Telephone Encounter (Signed)
Sorry 272.4

## 2012-05-27 NOTE — Telephone Encounter (Signed)
For the lipid i need another dx code?

## 2012-05-27 NOTE — Telephone Encounter (Signed)
Pt is coming in for labs On January 2nd what labs and dx would you like? Thank you

## 2012-05-27 NOTE — Telephone Encounter (Signed)
PT/INR - chronic anticoagulation CBC, CMP, lipids - V70.9

## 2012-05-29 ENCOUNTER — Other Ambulatory Visit (INDEPENDENT_AMBULATORY_CARE_PROVIDER_SITE_OTHER): Payer: Medicare Other

## 2012-05-29 DIAGNOSIS — Z0000 Encounter for general adult medical examination without abnormal findings: Secondary | ICD-10-CM

## 2012-05-29 DIAGNOSIS — Z7901 Long term (current) use of anticoagulants: Secondary | ICD-10-CM

## 2012-05-29 DIAGNOSIS — E785 Hyperlipidemia, unspecified: Secondary | ICD-10-CM

## 2012-05-29 LAB — COMPREHENSIVE METABOLIC PANEL
ALT: 15 U/L (ref 0–35)
Albumin: 3.5 g/dL (ref 3.5–5.2)
Alkaline Phosphatase: 55 U/L (ref 39–117)
CO2: 30 mEq/L (ref 19–32)
Potassium: 4.1 mEq/L (ref 3.5–5.1)
Sodium: 140 mEq/L (ref 135–145)
Total Bilirubin: 0.5 mg/dL (ref 0.3–1.2)
Total Protein: 7 g/dL (ref 6.0–8.3)

## 2012-05-29 LAB — CBC WITH DIFFERENTIAL/PLATELET
Basophils Absolute: 0.1 10*3/uL (ref 0.0–0.1)
Basophils Relative: 0.8 % (ref 0.0–3.0)
Eosinophils Absolute: 0.2 10*3/uL (ref 0.0–0.7)
Hemoglobin: 10.3 g/dL — ABNORMAL LOW (ref 12.0–15.0)
Lymphocytes Relative: 23.4 % (ref 12.0–46.0)
MCHC: 32.8 g/dL (ref 30.0–36.0)
Monocytes Relative: 10.9 % (ref 3.0–12.0)
Neutro Abs: 4.7 10*3/uL (ref 1.4–7.7)
Neutrophils Relative %: 62.8 % (ref 43.0–77.0)
RBC: 3.63 Mil/uL — ABNORMAL LOW (ref 3.87–5.11)

## 2012-05-29 LAB — PROTIME-INR: INR: 1.9 ratio — ABNORMAL HIGH (ref 0.8–1.0)

## 2012-05-29 LAB — LIPID PANEL
LDL Cholesterol: 119 mg/dL — ABNORMAL HIGH (ref 0–99)
VLDL: 22 mg/dL (ref 0.0–40.0)

## 2012-06-02 ENCOUNTER — Other Ambulatory Visit: Payer: Self-pay | Admitting: Internal Medicine

## 2012-06-03 ENCOUNTER — Other Ambulatory Visit: Payer: Self-pay | Admitting: *Deleted

## 2012-06-03 ENCOUNTER — Other Ambulatory Visit: Payer: Self-pay | Admitting: Internal Medicine

## 2012-06-03 DIAGNOSIS — Z7901 Long term (current) use of anticoagulants: Secondary | ICD-10-CM

## 2012-06-03 MED ORDER — OXYBUTYNIN CHLORIDE ER 5 MG PO TB24: 5.0000 mg | ORAL_TABLET | Freq: Every day | ORAL | Status: DC

## 2012-06-03 NOTE — Telephone Encounter (Signed)
Fine to fill #30. Needs follow up appt.

## 2012-06-03 NOTE — Telephone Encounter (Signed)
Oxybutynin ER 5 mg tablets  #90

## 2012-06-03 NOTE — Telephone Encounter (Signed)
Patient hasn't been seen since 04/2011, ok to refill?

## 2012-06-03 NOTE — Telephone Encounter (Signed)
Rx sent to pharmacy   

## 2012-06-05 ENCOUNTER — Other Ambulatory Visit (INDEPENDENT_AMBULATORY_CARE_PROVIDER_SITE_OTHER): Payer: Medicare Other

## 2012-06-05 DIAGNOSIS — Z7901 Long term (current) use of anticoagulants: Secondary | ICD-10-CM

## 2012-06-05 LAB — PROTIME-INR
INR: 2.5 ratio — ABNORMAL HIGH (ref 0.8–1.0)
Prothrombin Time: 26.1 s — ABNORMAL HIGH (ref 10.2–12.4)

## 2012-07-03 ENCOUNTER — Other Ambulatory Visit: Payer: Self-pay | Admitting: Internal Medicine

## 2012-07-03 ENCOUNTER — Other Ambulatory Visit (INDEPENDENT_AMBULATORY_CARE_PROVIDER_SITE_OTHER): Payer: Medicare Other

## 2012-07-03 DIAGNOSIS — Z79899 Other long term (current) drug therapy: Secondary | ICD-10-CM

## 2012-07-03 DIAGNOSIS — Z7901 Long term (current) use of anticoagulants: Secondary | ICD-10-CM

## 2012-07-03 LAB — PROTIME-INR: Prothrombin Time: 26.8 s — ABNORMAL HIGH (ref 10.2–12.4)

## 2012-07-03 NOTE — Telephone Encounter (Signed)
Received refill request electronically. Patient has appointment scheduled 07/04/12. Is it okay to refill medication?

## 2012-07-04 ENCOUNTER — Ambulatory Visit (INDEPENDENT_AMBULATORY_CARE_PROVIDER_SITE_OTHER): Payer: Medicare Other | Admitting: Internal Medicine

## 2012-07-04 ENCOUNTER — Encounter: Payer: Self-pay | Admitting: Internal Medicine

## 2012-07-04 VITALS — BP 144/72 | HR 85 | Temp 97.6°F | Wt 145.0 lb

## 2012-07-04 DIAGNOSIS — D649 Anemia, unspecified: Secondary | ICD-10-CM

## 2012-07-04 DIAGNOSIS — I10 Essential (primary) hypertension: Secondary | ICD-10-CM

## 2012-07-04 DIAGNOSIS — I4891 Unspecified atrial fibrillation: Secondary | ICD-10-CM

## 2012-07-04 DIAGNOSIS — J069 Acute upper respiratory infection, unspecified: Secondary | ICD-10-CM

## 2012-07-04 DIAGNOSIS — Z950 Presence of cardiac pacemaker: Secondary | ICD-10-CM

## 2012-07-04 DIAGNOSIS — Z7901 Long term (current) use of anticoagulants: Secondary | ICD-10-CM

## 2012-07-04 NOTE — Assessment & Plan Note (Signed)
Pacemaker in place. NSR today. Anticoagulated with warfarin, recent INR therapeutic. Continue current medications. Follow up 3 months.

## 2012-07-04 NOTE — Assessment & Plan Note (Signed)
Recent INR therapeutic. Will continue coumadin current dose. Repeat INR in 1 month.

## 2012-07-04 NOTE — Progress Notes (Signed)
Subjective:    Patient ID: Beverly Powell, female    DOB: 1919/07/03, 79 y.o.   MRN: [id number]  HPI 79YO female with h/o afib s/p pacemaker placement, HTN, CKD3 presents for follow up.  AFIB- Asymptomatic. Recent INR 2.6. Compliant with meds. Scheduled for [facility] follow up next month.  HTN - No headache, palpitations, chest pain. Compliant with meds. BP has been well controlled.  Notes nasal congestion and drainage over last few days. No fever, chills, sinus pressure, cough, dyspnea.  Took Delsym on one occasion with improvement in symptoms.  Outpatient Encounter Prescriptions as of 07/04/2012  Medication Sig Dispense Refill  . amLODipine (NORVASC) 10 MG tablet Take 1 tablet (10 mg total) by mouth daily.  30 tablet  11  . aspirin 81 MG tablet Take 81 mg by mouth daily.        . Cholecalciferol (VITAMIN D3) 400 UNITS CAPS Take 1 tablet by mouth daily.        . furosemide (LASIX) 20 MG tablet Take 1 tablet (20 mg total) by mouth daily.  30 tablet  5  . hydrochlorothiazide (HYDRODIURIL) 25 MG tablet TAKE 1 TABLET BY MOUTH EVERY DAY  30 tablet  0  . metoprolol tartrate (LOPRESSOR) 25 MG tablet Take by mouth daily.        . valsartan (DIOVAN) 320 MG tablet Take 1 tablet (320 mg total) by mouth daily.  90 tablet  4  . warfarin (COUMADIN) 5 MG tablet Take 1 tablet (5 mg total) by mouth daily.  30 tablet  3  . [DISCONTINUED] oxybutynin (DITROPAN-XL) 5 MG 24 hr tablet Take 1 tablet (5 mg total) by mouth daily.  30 tablet  0  . Calcium Carbonate (CALCIUM 600 PO) Take 1 tablet by mouth daily.        . [DISCONTINUED] warfarin (COUMADIN) 4 MG tablet Take 1 tablet (4 mg total) by mouth daily.  30 tablet  3   BP 144/72  Pulse 85  Temp 97.6 F (36.4 C) (Oral)  Wt 145 lb (65.772 kg)  SpO2 97%  Review of Systems  Constitutional: Negative for fever, chills, appetite change, fatigue and unexpected weight change.  HENT: Positive for rhinorrhea and postnasal drip. Negative for ear pain, congestion, sore  throat, trouble swallowing, neck pain, voice change and sinus pressure.   Eyes: Negative for visual disturbance.  Respiratory: Negative for cough, shortness of breath, wheezing and stridor.   Cardiovascular: Negative for chest pain, palpitations and leg swelling.  Gastrointestinal: Negative for nausea, vomiting, abdominal pain, diarrhea, constipation, blood in stool, abdominal distention and anal bleeding.  Genitourinary: Negative for dysuria and flank pain.  Musculoskeletal: Negative for myalgias, arthralgias and gait problem.  Skin: Negative for color change and rash.  Neurological: Negative for dizziness and headaches.  Hematological: Negative for adenopathy. Does not bruise/bleed easily.  Psychiatric/Behavioral: Negative for suicidal ideas, sleep disturbance and dysphoric mood. The patient is not nervous/anxious.        Objective:   Physical Exam  Constitutional: She is oriented to person, place, and time. She appears well-developed and well-nourished. No distress.  HENT:  Head: Normocephalic and atraumatic.  Right Ear: Tympanic membrane and external ear normal.  Left Ear: Tympanic membrane and external ear normal.  Nose: Nose normal.  Mouth/Throat: Oropharynx is clear and moist. No oropharyngeal exudate.  Eyes: Conjunctivae normal are normal. Pupils are equal, round, and reactive to light. Right eye exhibits no discharge. Left eye exhibits no discharge. No scleral icterus.  Neck: Normal range of  motion. Neck supple. No tracheal deviation present. No thyromegaly present.  Cardiovascular: Normal rate, regular rhythm, normal heart sounds and intact distal pulses.  Exam reveals no gallop and no friction rub.   No murmur heard. Pulmonary/Chest: Effort normal and breath sounds normal. No respiratory distress. She has no wheezes. She has no rales. She exhibits no tenderness.  Musculoskeletal: Normal range of motion. She exhibits no edema and no tenderness.  Lymphadenopathy:    She has no  cervical adenopathy.  Neurological: She is alert and oriented to person, place, and time. No cranial nerve deficit. She exhibits normal muscle tone. Coordination normal.  Skin: Skin is warm and dry. No rash noted. She is not diaphoretic. No erythema. No pallor.  Psychiatric: She has a normal mood and affect. Her behavior is normal. Judgment and thought content normal.          Assessment & Plan:

## 2012-07-04 NOTE — Assessment & Plan Note (Addendum)
History of mild AOCD. Recent Hgb 10.3. Stable when compared with outside labs over 2 years. Asymptomatic. Will plan to repeat in 1-3 months.

## 2012-07-04 NOTE — Assessment & Plan Note (Signed)
Recent renal function stable. Will plan to repeat 3 months.

## 2012-07-04 NOTE — Assessment & Plan Note (Signed)
Follow up with cardiology at [facility] as scheduled next month.

## 2012-07-04 NOTE — Assessment & Plan Note (Signed)
Symptoms consistent with viral URI with post-nasal drip. Will continue supportive care. Claritin or benadryl prn. Robitussin DM prn cough. Pt will call if symptoms do not continue to improve.

## 2012-07-04 NOTE — Assessment & Plan Note (Signed)
BP Readings from Last 3 Encounters:  07/04/12 144/72  05/04/11 114/78  02/01/11 137/67   BP generally well controlled on current medications. Renal function stable in 05/2012. Will continue current meds. Follow up 3 months and prn.

## 2012-07-17 ENCOUNTER — Other Ambulatory Visit: Payer: Self-pay | Admitting: *Deleted

## 2012-07-17 MED ORDER — WARFARIN SODIUM 5 MG PO TABS: 5.0000 mg | ORAL_TABLET | Freq: Every day | ORAL | Status: DC

## 2012-07-17 NOTE — Telephone Encounter (Signed)
Daughter called stating her mother Beverly Powell needed refills on her coumadin. Rx sent to pharmacy on file.

## 2012-07-21 ENCOUNTER — Other Ambulatory Visit: Payer: Self-pay | Admitting: *Deleted

## 2012-07-21 ENCOUNTER — Telehealth: Payer: Self-pay | Admitting: Internal Medicine

## 2012-07-21 MED ORDER — WARFARIN SODIUM 4 MG PO TABS: 4.0000 mg | ORAL_TABLET | Freq: Every day | ORAL | Status: DC

## 2012-07-21 NOTE — Telephone Encounter (Signed)
In reviewing the chart, it appears the comadin dose was changed 03/13/12 to 4mg  q day.  I do not see where it has been changed since.  It appears pt is also confirming that she takes 4mg  q day.  Would continue on 4mg  coumadin q day and show to Dr Dan Humphreys (upon her return) to confirm she does not want to do anything different.  Ok to refill.  Regarding the low blood pressure - if she is having issues with her blood pressure and symptoms - needs evaluation.  If acute symptoms, needs eval today.  See me if any questions.

## 2012-07-21 NOTE — Telephone Encounter (Signed)
See unrouted note.

## 2012-07-21 NOTE — Telephone Encounter (Signed)
Dr Timmie Foerster is out of the office until Wednesday.  This bp is normal.  There is no documentation of any prior conversation about why patient's daughter is calling us with BP.

## 2012-07-21 NOTE — Telephone Encounter (Signed)
Duplicate. See other message.

## 2012-07-21 NOTE — Telephone Encounter (Addendum)
Pharmacist Beverly Powell called and stated patient has been taking 4 mg. Coumadin but 5 mg tablet was sent in. Called and spoke with patient daughter she was not sure of the exact strength but her mother, the patient state she is taking 4 mg. Looked up the past lab results for her INR, the dose is 4 mg daily. Please advise what should be sent in because according to lab notes, it should be 4 mg but a rx for 5 mg was sent to the pharmacy in October. Also when I was talking with her daughter she informed me that Beverly Powell's BP has been running low like 100/40 and she is always cold.

## 2012-07-21 NOTE — Telephone Encounter (Signed)
Informed patient daughter and she agreed.

## 2012-07-21 NOTE — Telephone Encounter (Signed)
Caller: Nancy/Child; Phone: [phone]; Reason for Call: Harriett Sine calling back with her mom's b/p, same is 101/47 this afternoon.  She had spoke with someone earlier and they were going to talk with Dr.  Dan Humphreys.

## 2012-07-22 NOTE — Telephone Encounter (Signed)
Spoke with daughter and note was routed to Dr. Lorin Picket, patient daughter then called the triage nurse with the same concerns, please refer to previous note in anything further is needed.

## 2012-07-30 ENCOUNTER — Telehealth: Payer: Self-pay | Admitting: Internal Medicine

## 2012-07-30 ENCOUNTER — Other Ambulatory Visit: Payer: Self-pay | Admitting: *Deleted

## 2012-07-30 NOTE — Telephone Encounter (Signed)
INR. Chronic anticoagulation

## 2012-07-30 NOTE — Telephone Encounter (Signed)
Pt states she is to come in for labs and made appt 10/6 at 10:30 a.m.

## 2012-07-30 NOTE — Telephone Encounter (Signed)
Pt is coming in for labs tomorrow 03.06.2014 What labs and dx would you like? Thank you

## 2012-07-31 ENCOUNTER — Other Ambulatory Visit (INDEPENDENT_AMBULATORY_CARE_PROVIDER_SITE_OTHER): Payer: Medicare Other

## 2012-07-31 ENCOUNTER — Other Ambulatory Visit: Payer: Self-pay | Admitting: *Deleted

## 2012-07-31 DIAGNOSIS — Z7901 Long term (current) use of anticoagulants: Secondary | ICD-10-CM

## 2012-07-31 MED ORDER — HYDROCHLOROTHIAZIDE 25 MG PO TABS: 25.0000 mg | ORAL_TABLET | Freq: Every day | ORAL | Status: DC

## 2012-07-31 NOTE — Telephone Encounter (Signed)
Eprescribed.

## 2012-08-05 ENCOUNTER — Other Ambulatory Visit: Payer: Self-pay | Admitting: *Deleted

## 2012-08-05 MED ORDER — OXYBUTYNIN CHLORIDE ER 5 MG PO TB24: ORAL_TABLET | ORAL | Status: DC

## 2012-08-05 NOTE — Telephone Encounter (Signed)
Faxed

## 2012-08-07 ENCOUNTER — Other Ambulatory Visit (INDEPENDENT_AMBULATORY_CARE_PROVIDER_SITE_OTHER): Payer: Medicare Other

## 2012-08-21 ENCOUNTER — Other Ambulatory Visit (INDEPENDENT_AMBULATORY_CARE_PROVIDER_SITE_OTHER): Payer: Medicare Other

## 2012-08-21 DIAGNOSIS — Z7901 Long term (current) use of anticoagulants: Secondary | ICD-10-CM

## 2012-08-21 LAB — PROTIME-INR: INR: 2.6 ratio — ABNORMAL HIGH (ref 0.8–1.0)

## 2012-09-04 ENCOUNTER — Other Ambulatory Visit: Payer: Self-pay | Admitting: *Deleted

## 2012-09-04 MED ORDER — OXYBUTYNIN CHLORIDE ER 5 MG PO TB24: ORAL_TABLET | ORAL | Status: DC

## 2012-09-04 NOTE — Telephone Encounter (Signed)
Eprescribed.

## 2012-09-05 ENCOUNTER — Other Ambulatory Visit: Payer: Self-pay | Admitting: *Deleted

## 2012-09-05 LAB — PACEMAKER DEVICE OBSERVATION

## 2012-09-05 MED ORDER — HYDROCHLOROTHIAZIDE 25 MG PO TABS: 25.0000 mg | ORAL_TABLET | Freq: Every day | ORAL | Status: DC

## 2012-09-05 NOTE — Telephone Encounter (Signed)
Eprescribed.

## 2012-09-17 ENCOUNTER — Telehealth: Payer: Self-pay | Admitting: *Deleted

## 2012-09-17 NOTE — Telephone Encounter (Signed)
Pt is coming in tomorrow 04.24.2014 for labs, what labs and dx code would you like?  Thank you

## 2012-09-17 NOTE — Telephone Encounter (Signed)
Repeat INR. Chronic anticoagulation.

## 2012-09-18 ENCOUNTER — Other Ambulatory Visit (INDEPENDENT_AMBULATORY_CARE_PROVIDER_SITE_OTHER): Payer: Medicare Other

## 2012-09-18 ENCOUNTER — Other Ambulatory Visit: Payer: Self-pay | Admitting: *Deleted

## 2012-09-18 DIAGNOSIS — Z7901 Long term (current) use of anticoagulants: Secondary | ICD-10-CM

## 2012-09-18 LAB — PROTIME-INR: Prothrombin Time: 31.9 s — ABNORMAL HIGH (ref 10.2–12.4)

## 2012-09-24 ENCOUNTER — Telehealth: Payer: Self-pay | Admitting: Internal Medicine

## 2012-09-24 ENCOUNTER — Other Ambulatory Visit: Payer: Self-pay | Admitting: *Deleted

## 2012-09-24 DIAGNOSIS — Z7901 Long term (current) use of anticoagulants: Secondary | ICD-10-CM

## 2012-09-24 NOTE — Telephone Encounter (Signed)
Noted, patient coming back in for repeat INR

## 2012-09-24 NOTE — Telephone Encounter (Signed)
yes

## 2012-09-24 NOTE — Telephone Encounter (Signed)
Pt is coming in for labs tomorrow 05.01.2014, is it just for a pt/inr?

## 2012-09-24 NOTE — Telephone Encounter (Signed)
Pt called to schedule labs, says she had labs last week and was told to come back this week for labs again.  Appt 5/1 10:30am

## 2012-09-25 ENCOUNTER — Other Ambulatory Visit (INDEPENDENT_AMBULATORY_CARE_PROVIDER_SITE_OTHER): Payer: Medicare Other

## 2012-09-25 DIAGNOSIS — Z7901 Long term (current) use of anticoagulants: Secondary | ICD-10-CM

## 2012-09-25 LAB — PROTIME-INR
INR: 2.9 ratio — ABNORMAL HIGH (ref 0.8–1.0)
Prothrombin Time: 30.5 s — ABNORMAL HIGH (ref 10.2–12.4)

## 2012-10-02 ENCOUNTER — Other Ambulatory Visit: Payer: Self-pay | Admitting: *Deleted

## 2012-10-02 ENCOUNTER — Telehealth: Payer: Self-pay | Admitting: *Deleted

## 2012-10-02 MED ORDER — OXYBUTYNIN CHLORIDE ER 5 MG PO TB24: ORAL_TABLET | ORAL | Status: DC

## 2012-10-02 NOTE — Telephone Encounter (Signed)
Patient has not been seen in awhile, does she need an appointment? The last appointment I seen in here was in 2012

## 2012-10-02 NOTE — Telephone Encounter (Signed)
Yes, she should be seen. visit

## 2012-10-07 ENCOUNTER — Other Ambulatory Visit: Payer: Self-pay | Admitting: Internal Medicine

## 2012-10-07 MED ORDER — HYDROCHLOROTHIAZIDE 25 MG PO TABS: 25.0000 mg | ORAL_TABLET | Freq: Every day | ORAL | Status: DC

## 2012-10-07 NOTE — Telephone Encounter (Signed)
hydrochlorothiazide (HYDRODIURIL) 25 MG tablet ° ° °

## 2012-10-07 NOTE — Telephone Encounter (Signed)
Left message to call back  

## 2012-10-07 NOTE — Telephone Encounter (Signed)
15 TABLETS WERE SENT TO PHARMACY WITH NOTATION PATIENT WILL NOT GET ANYMORE REFILLS UNTIL SHE IS SEEN.

## 2012-10-08 NOTE — Telephone Encounter (Signed)
Patient daughter called back spoke with Erie Noe and scheduled an appointment. She was also seen in February this year.

## 2012-10-23 ENCOUNTER — Other Ambulatory Visit: Payer: Self-pay | Admitting: *Deleted

## 2012-10-23 MED ORDER — HYDROCHLOROTHIAZIDE 25 MG PO TABS: 25.0000 mg | ORAL_TABLET | Freq: Every day | ORAL | Status: DC

## 2012-10-23 NOTE — Telephone Encounter (Signed)
Eprescribed.

## 2012-10-24 IMAGING — CR DG CHEST 1V
1 series · 1 of 1 positions shown · non-contrast
Comparison: none

COMMENTS:

PROCEDURE:     DXR - DXR CHEST SPECIFY LAT OR DECUB  - April 28, 2010 [DATE]
RESULT:     A right-side-down decubitus film was performed. There is a tiny
pleural effusion on the right. It layers to a small degree but a large
volume of layering fluid is not demonstrated.

[view not recorded]
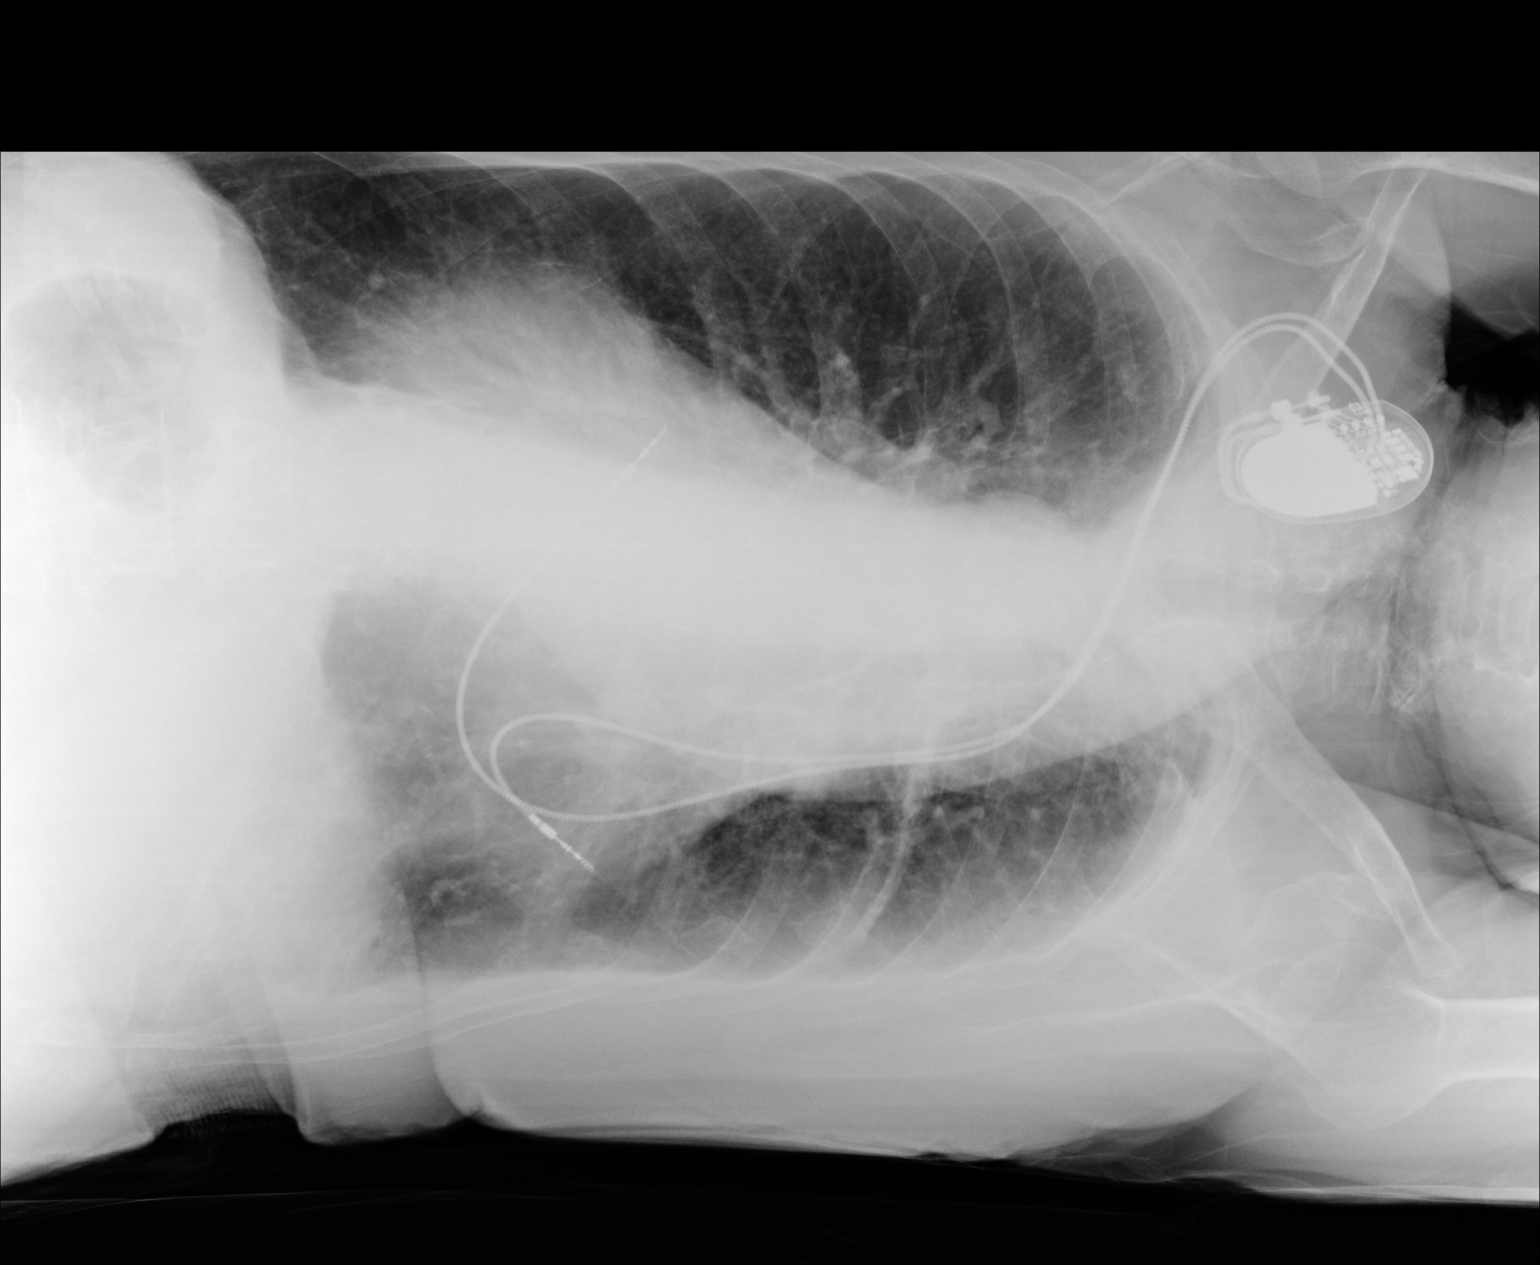

[1 of 1 positions shown; findings below may reference images not displayed]

IMPRESSION: The patient has a small pleural effusion. There is some
layering of this effusion but overall the volume of fluid is felt to be
quite small.

## 2012-10-29 ENCOUNTER — Other Ambulatory Visit: Payer: Self-pay | Admitting: Internal Medicine

## 2012-10-29 ENCOUNTER — Encounter: Payer: Self-pay | Admitting: Internal Medicine

## 2012-10-29 NOTE — Telephone Encounter (Signed)
Please Advise

## 2012-10-30 ENCOUNTER — Other Ambulatory Visit (INDEPENDENT_AMBULATORY_CARE_PROVIDER_SITE_OTHER): Payer: Medicare Other

## 2012-10-30 DIAGNOSIS — Z7901 Long term (current) use of anticoagulants: Secondary | ICD-10-CM

## 2012-10-30 LAB — PROTIME-INR: INR: 2.7 ratio — ABNORMAL HIGH (ref 0.8–1.0)

## 2012-11-21 ENCOUNTER — Other Ambulatory Visit: Payer: Self-pay | Admitting: Internal Medicine

## 2012-11-21 NOTE — Telephone Encounter (Signed)
Eprescribed.

## 2012-11-25 ENCOUNTER — Other Ambulatory Visit: Payer: Self-pay | Admitting: Internal Medicine

## 2012-12-01 ENCOUNTER — Other Ambulatory Visit: Payer: Self-pay | Admitting: *Deleted

## 2012-12-01 DIAGNOSIS — Z7901 Long term (current) use of anticoagulants: Secondary | ICD-10-CM

## 2012-12-02 ENCOUNTER — Other Ambulatory Visit (INDEPENDENT_AMBULATORY_CARE_PROVIDER_SITE_OTHER): Payer: Medicare Other

## 2012-12-02 DIAGNOSIS — Z7901 Long term (current) use of anticoagulants: Secondary | ICD-10-CM

## 2012-12-02 LAB — PROTIME-INR: Prothrombin Time: 22.5 s — ABNORMAL HIGH (ref 10.2–12.4)

## 2012-12-04 ENCOUNTER — Other Ambulatory Visit: Payer: Self-pay | Admitting: Internal Medicine

## 2012-12-23 ENCOUNTER — Other Ambulatory Visit: Payer: Self-pay | Admitting: Internal Medicine

## 2012-12-24 ENCOUNTER — Ambulatory Visit: Payer: Medicare Other | Admitting: Internal Medicine

## 2012-12-29 ENCOUNTER — Ambulatory Visit (INDEPENDENT_AMBULATORY_CARE_PROVIDER_SITE_OTHER): Payer: Medicare Other | Admitting: Internal Medicine

## 2012-12-29 ENCOUNTER — Encounter: Payer: Self-pay | Admitting: Internal Medicine

## 2012-12-29 VITALS — BP 104/52 | HR 65 | Temp 98.1°F | Wt 142.0 lb

## 2012-12-29 DIAGNOSIS — Z23 Encounter for immunization: Secondary | ICD-10-CM

## 2012-12-29 DIAGNOSIS — I4891 Unspecified atrial fibrillation: Secondary | ICD-10-CM

## 2012-12-29 DIAGNOSIS — Z7901 Long term (current) use of anticoagulants: Secondary | ICD-10-CM

## 2012-12-29 DIAGNOSIS — I10 Essential (primary) hypertension: Secondary | ICD-10-CM

## 2012-12-29 DIAGNOSIS — D649 Anemia, unspecified: Secondary | ICD-10-CM

## 2012-12-29 LAB — COMPREHENSIVE METABOLIC PANEL
AST: 26 U/L (ref 0–37)
Alkaline Phosphatase: 44 U/L (ref 39–117)
BUN: 42 mg/dL — ABNORMAL HIGH (ref 6–23)
Calcium: 10 mg/dL (ref 8.4–10.5)
Creatinine, Ser: 1.5 mg/dL — ABNORMAL HIGH (ref 0.4–1.2)

## 2012-12-29 LAB — CBC WITH DIFFERENTIAL/PLATELET
Eosinophils Relative: 0.6 % (ref 0.0–5.0)
HCT: 31.5 % — ABNORMAL LOW (ref 36.0–46.0)
Hemoglobin: 10.2 g/dL — ABNORMAL LOW (ref 12.0–15.0)
Lymphs Abs: 1.7 10*3/uL (ref 0.7–4.0)
Monocytes Relative: 8.4 % (ref 3.0–12.0)
Platelets: 199 10*3/uL (ref 150.0–400.0)
WBC: 7 10*3/uL (ref 4.5–10.5)

## 2012-12-29 LAB — PROTIME-INR: INR: 2.4 ratio — ABNORMAL HIGH (ref 0.8–1.0)

## 2012-12-29 MED ORDER — WARFARIN SODIUM 4 MG PO TABS: ORAL_TABLET | ORAL | Status: AC

## 2012-12-29 MED ORDER — AMLODIPINE BESYLATE 10 MG PO TABS: 10.0000 mg | ORAL_TABLET | Freq: Every day | ORAL | Status: AC

## 2012-12-29 MED ORDER — OXYBUTYNIN CHLORIDE ER 5 MG PO TB24: ORAL_TABLET | ORAL | Status: AC

## 2012-12-29 MED ORDER — HYDROCHLOROTHIAZIDE 25 MG PO TABS: 25.0000 mg | ORAL_TABLET | Freq: Every day | ORAL | Status: AC

## 2012-12-29 MED ORDER — FUROSEMIDE 20 MG PO TABS: ORAL_TABLET | ORAL | Status: AC

## 2012-12-29 NOTE — Assessment & Plan Note (Signed)
Secondary to AOCD and CKD. Hgb stable. Asymptomatic. Will continue to monitor.

## 2012-12-29 NOTE — Assessment & Plan Note (Signed)
Cr stable on labs today. Will continue to monitor.

## 2012-12-29 NOTE — Progress Notes (Signed)
Subjective:    Patient ID: Beverly Powell, female    DOB: 1920/03/03, 79 y.o.   MRN: [id number]  HPI  79 year old female with history of atrial fibrillation status post pacemaker on chronic anticoagulation, hypertension, anemia of chronic disease, chronic kidney disease presents for followup. She reports she is feeling well. She has occasional mild fatigue. She denies any shortness of breath, chest pain, palpitations. She has a good appetite. She is compliant with her medications. She occasionally has some swelling in her lower legs. This is described as mild. It improves with keeping her legs elevated. No new concerns today.  Outpatient Encounter Prescriptions as of 12/29/2012  Medication Sig Dispense Refill  . amLODipine (NORVASC) 10 MG tablet Take 1 tablet (10 mg total) by mouth daily.  30 tablet  11  . aspirin 81 MG tablet Take 81 mg by mouth daily.        . furosemide (LASIX) 20 MG tablet TAKE 1 TABLET BY MOUTH EVERY DAY  30 tablet  11  . hydrochlorothiazide (HYDRODIURIL) 25 MG tablet Take 1 tablet (25 mg total) by mouth daily.  30 tablet  11  . metoprolol tartrate (LOPRESSOR) 25 MG tablet Take by mouth daily.        Marland Kitchen oxybutynin (DITROPAN-XL) 5 MG 24 hr tablet TAKE 1 TABLET BY MOUTH EVERY DAY  30 tablet  11  . valsartan (DIOVAN) 320 MG tablet Take 1 tablet (320 mg total) by mouth daily.  90 tablet  4  . warfarin (COUMADIN) 4 MG tablet TAKE 1 TABLET BY MOUTH EVERY DAY  30 tablet  11  . Calcium Carbonate (CALCIUM 600 PO) Take 1 tablet by mouth daily.        . Cholecalciferol (VITAMIN D3) 400 UNITS CAPS Take 1 tablet by mouth daily.         No facility-administered encounter medications on file as of 12/29/2012.   BP 104/52  Pulse 65  Temp(Src) 98.1 F (36.7 C) (Oral)  Wt 142 lb (64.411 kg)  BMI 25.97 kg/m2  SpO2 99%  Review of Systems  Constitutional: Negative for fever, chills, appetite change, fatigue and unexpected weight change.  HENT: Negative for ear pain, congestion, sore  throat, trouble swallowing, neck pain, voice change and sinus pressure.   Eyes: Negative for visual disturbance.  Respiratory: Negative for cough, shortness of breath, wheezing and stridor.   Cardiovascular: Negative for chest pain, palpitations and leg swelling.  Gastrointestinal: Negative for nausea, vomiting, abdominal pain, diarrhea, constipation, blood in stool, abdominal distention and anal bleeding.  Genitourinary: Negative for dysuria and flank pain.  Musculoskeletal: Negative for myalgias, arthralgias and gait problem.  Skin: Negative for color change and rash.  Neurological: Negative for dizziness and headaches.  Hematological: Negative for adenopathy. Does not bruise/bleed easily.  Psychiatric/Behavioral: Negative for suicidal ideas, sleep disturbance and dysphoric mood. The patient is not nervous/anxious.        Objective:   Physical Exam  Constitutional: She is oriented to person, place, and time. She appears well-developed and well-nourished. No distress.  HENT:  Head: Normocephalic and atraumatic.  Right Ear: External ear normal.  Left Ear: External ear normal.  Nose: Nose normal.  Mouth/Throat: Oropharynx is clear and moist. No oropharyngeal exudate.  Eyes: Conjunctivae are normal. Pupils are equal, round, and reactive to light. Right eye exhibits no discharge. Left eye exhibits no discharge. No scleral icterus.  Neck: Normal range of motion. Neck supple. No tracheal deviation present. No thyromegaly present.  Cardiovascular: Normal rate, regular rhythm, normal  heart sounds and intact distal pulses.  Exam reveals no gallop and no friction rub.   No murmur heard. Pulmonary/Chest: Effort normal and breath sounds normal. No accessory muscle usage. Not tachypneic. No respiratory distress. She has no decreased breath sounds. She has no wheezes. She has no rhonchi. She has no rales. She exhibits no tenderness.  Musculoskeletal: Normal range of motion. She exhibits no edema and  no tenderness.  Lymphadenopathy:    She has no cervical adenopathy.  Neurological: She is alert and oriented to person, place, and time. No cranial nerve deficit. She exhibits normal muscle tone. Coordination normal.  Skin: Skin is warm and dry. No rash noted. She is not diaphoretic. No erythema. No pallor.  Psychiatric: She has a normal mood and affect. Her behavior is normal. Judgment and thought content normal.          Assessment & Plan:

## 2012-12-29 NOTE — Assessment & Plan Note (Signed)
INR 2.4, at goal. Continue coumadin. Recheck INR 1 month.

## 2012-12-29 NOTE — Assessment & Plan Note (Signed)
Currently NSR. Will continue current medications.

## 2012-12-29 NOTE — Assessment & Plan Note (Signed)
BP Readings from Last 3 Encounters:  12/29/12 104/52  07/04/12 144/72  05/04/11 114/78   BP well controlled on current medications. Trace LE edema with Amlodipine. If this becomes bothersome, may need to consider an alternative agent. Follow up 3 months and prn.

## 2012-12-30 ENCOUNTER — Encounter: Payer: Self-pay | Admitting: *Deleted

## 2012-12-30 NOTE — Telephone Encounter (Signed)
Angel left message on voicemail stating patient CT was negative and she let the patient go home. She has faxed over the report.

## 2012-12-30 NOTE — Telephone Encounter (Signed)
I didn't order a CT.   

## 2012-12-31 NOTE — Telephone Encounter (Signed)
Spoke with Lawanna Kobus, left the wrong patient name.

## 2013-01-07 ENCOUNTER — Ambulatory Visit: Payer: Medicare Other | Admitting: Internal Medicine

## 2013-02-04 ENCOUNTER — Other Ambulatory Visit: Payer: Self-pay | Admitting: *Deleted

## 2013-02-04 ENCOUNTER — Other Ambulatory Visit (INDEPENDENT_AMBULATORY_CARE_PROVIDER_SITE_OTHER): Payer: Medicare Other

## 2013-02-04 DIAGNOSIS — Z7901 Long term (current) use of anticoagulants: Secondary | ICD-10-CM

## 2013-02-04 LAB — PROTIME-INR: Prothrombin Time: 37.8 s — ABNORMAL HIGH (ref 10.2–12.4)

## 2013-02-09 ENCOUNTER — Telehealth: Payer: Self-pay | Admitting: *Deleted

## 2013-02-09 MED ORDER — WARFARIN SODIUM 3 MG PO TABS: 3.0000 mg | ORAL_TABLET | Freq: Every day | ORAL | Status: DC

## 2013-02-09 NOTE — Telephone Encounter (Signed)
Rx for 3 mg tablets sent to the pharmacy, all patient had at home was 5 mg tablets.

## 2013-02-09 NOTE — Telephone Encounter (Signed)
Message copied by Theola Sequin on Mon Feb 09, 2013 12:44 PM ------      Message from: Ronna Polio A      Created: Wed Feb 04, 2013  4:10 PM       Labs show that coumadin level was too high. Please hold coumadin today and then let's restart coumadin at 3mg  daily. Recheck INR 1 week. ------

## 2013-02-10 ENCOUNTER — Other Ambulatory Visit: Payer: Self-pay | Admitting: Internal Medicine

## 2013-02-10 NOTE — Telephone Encounter (Signed)
Eprescribed.

## 2013-02-11 ENCOUNTER — Telehealth: Payer: Self-pay | Admitting: *Deleted

## 2013-02-11 NOTE — Telephone Encounter (Signed)
error 

## 2013-02-12 ENCOUNTER — Telehealth: Payer: Self-pay | Admitting: *Deleted

## 2013-02-12 ENCOUNTER — Other Ambulatory Visit (INDEPENDENT_AMBULATORY_CARE_PROVIDER_SITE_OTHER): Payer: Medicare Other

## 2013-02-12 DIAGNOSIS — Z7901 Long term (current) use of anticoagulants: Secondary | ICD-10-CM

## 2013-02-12 LAB — PROTIME-INR: INR: 2.6 ratio — ABNORMAL HIGH (ref 0.8–1.0)

## 2013-02-12 NOTE — Telephone Encounter (Signed)
What labs and dx?  

## 2013-02-12 NOTE — Telephone Encounter (Signed)
INR chronic anticoag

## 2013-02-27 ENCOUNTER — Other Ambulatory Visit (INDEPENDENT_AMBULATORY_CARE_PROVIDER_SITE_OTHER): Payer: Medicare Other

## 2013-02-27 DIAGNOSIS — I4891 Unspecified atrial fibrillation: Secondary | ICD-10-CM

## 2013-02-27 LAB — PROTIME-INR
INR: 2 ratio — ABNORMAL HIGH (ref 0.8–1.0)
Prothrombin Time: 20.4 s — ABNORMAL HIGH (ref 10.2–12.4)

## 2013-03-10 ENCOUNTER — Other Ambulatory Visit: Payer: Self-pay | Admitting: Internal Medicine

## 2013-03-25 ENCOUNTER — Other Ambulatory Visit (INDEPENDENT_AMBULATORY_CARE_PROVIDER_SITE_OTHER): Payer: Medicare Other

## 2013-03-25 DIAGNOSIS — I4891 Unspecified atrial fibrillation: Secondary | ICD-10-CM

## 2013-04-02 ENCOUNTER — Ambulatory Visit (INDEPENDENT_AMBULATORY_CARE_PROVIDER_SITE_OTHER): Payer: Medicare Other | Admitting: Internal Medicine

## 2013-04-02 ENCOUNTER — Encounter: Payer: Self-pay | Admitting: Internal Medicine

## 2013-04-02 VITALS — BP 104/50 | HR 68 | Temp 98.1°F | Wt 147.0 lb

## 2013-04-02 DIAGNOSIS — Z7901 Long term (current) use of anticoagulants: Secondary | ICD-10-CM

## 2013-04-02 DIAGNOSIS — I4891 Unspecified atrial fibrillation: Secondary | ICD-10-CM

## 2013-04-02 DIAGNOSIS — R252 Cramp and spasm: Secondary | ICD-10-CM

## 2013-04-02 DIAGNOSIS — I10 Essential (primary) hypertension: Secondary | ICD-10-CM

## 2013-04-02 NOTE — Assessment & Plan Note (Signed)
Appears to be in normal sinus rhythm today. Continue metoprolol for rate control. Continue Coumadin for anticoagulation. Goal INR 2-3.

## 2013-04-02 NOTE — Progress Notes (Signed)
Pre-visit discussion using our clinic review tool. No additional management support is needed unless otherwise documented below in the visit note.  

## 2013-04-02 NOTE — Progress Notes (Signed)
Subjective:    Patient ID: Beverly Powell, female    DOB: Apr 22, 1920, 79 y.o.   MRN: [id number]  HPI 79 year old female with history of hypertension and atrial fibrillation on chronic anticoagulation presents for followup. She reports that she has been feeling well. She is compliant with medication. No recent palpitations, chest pain, headache. No recent bleeding or bruising. No new concerns today.  She does note that she will need to change from valsartan to losartan because of insurance coverage with her next refill.  Outpatient Encounter Prescriptions as of 04/02/2013  Medication Sig  . amLODipine (NORVASC) 10 MG tablet Take 1 tablet (10 mg total) by mouth daily.  Marland Kitchen aspirin 81 MG tablet Take 81 mg by mouth daily.    . furosemide (LASIX) 20 MG tablet TAKE 1 TABLET BY MOUTH EVERY DAY  . hydrochlorothiazide (HYDRODIURIL) 25 MG tablet Take 1 tablet (25 mg total) by mouth daily.  . metoprolol tartrate (LOPRESSOR) 25 MG tablet Take by mouth daily.    Marland Kitchen oxybutynin (DITROPAN-XL) 5 MG 24 hr tablet TAKE 1 TABLET BY MOUTH EVERY DAY  . valsartan (DIOVAN) 320 MG tablet TAKE 1 TABLET BY MOUTH EVERY DAY  . warfarin (COUMADIN) 3 MG tablet TAKE ONE TABLET BY MOUTH EVERY DAY STARTING ON 02/10/13  . Calcium Carbonate (CALCIUM 600 PO) Take 1 tablet by mouth daily.    . Cholecalciferol (VITAMIN D3) 400 UNITS CAPS Take 1 tablet by mouth daily.    Marland Kitchen warfarin (COUMADIN) 4 MG tablet TAKE 1 TABLET BY MOUTH EVERY DAY   BP 104/50  Pulse 68  Temp(Src) 98.1 F (36.7 C) (Oral)  Wt 147 lb (66.679 kg)  SpO2 96%  Review of Systems  Constitutional: Negative for fever, chills, appetite change, fatigue and unexpected weight change.  HENT: Negative for congestion, ear pain, sinus pressure, sore throat, trouble swallowing and voice change.   Eyes: Negative for visual disturbance.  Respiratory: Negative for cough, shortness of breath, wheezing and stridor.   Cardiovascular: Negative for chest pain, palpitations and leg  swelling.  Gastrointestinal: Negative for nausea, vomiting, abdominal pain, diarrhea, constipation, blood in stool, abdominal distention and anal bleeding.  Genitourinary: Negative for dysuria and flank pain.  Musculoskeletal: Negative for arthralgias, gait problem, myalgias and neck pain.  Skin: Negative for color change and rash.  Neurological: Negative for dizziness and headaches.  Hematological: Negative for adenopathy. Does not bruise/bleed easily.  Psychiatric/Behavioral: Negative for suicidal ideas, sleep disturbance and dysphoric mood. The patient is not nervous/anxious.        Objective:   Physical Exam  Constitutional: She is oriented to person, place, and time. She appears well-developed and well-nourished. No distress.  HENT:  Head: Normocephalic and atraumatic.  Right Ear: External ear normal.  Left Ear: External ear normal.  Nose: Nose normal.  Mouth/Throat: Oropharynx is clear and moist. No oropharyngeal exudate.  Eyes: Conjunctivae are normal. Pupils are equal, round, and reactive to light. Right eye exhibits no discharge. Left eye exhibits no discharge. No scleral icterus.  Neck: Normal range of motion. Neck supple. No tracheal deviation present. No thyromegaly present.  Cardiovascular: Normal rate, regular rhythm, normal heart sounds and intact distal pulses.  Exam reveals no gallop and no friction rub.   No murmur heard. Pulmonary/Chest: Effort normal and breath sounds normal. No accessory muscle usage. Not tachypneic. No respiratory distress. She has no decreased breath sounds. She has no wheezes. She has no rhonchi. She has no rales. She exhibits no tenderness.  Musculoskeletal: Normal range of motion.  She exhibits no edema and no tenderness.  Lymphadenopathy:    She has no cervical adenopathy.  Neurological: She is alert and oriented to person, place, and time. No cranial nerve deficit. She exhibits normal muscle tone. Coordination normal.  Skin: Skin is warm and  dry. No rash noted. She is not diaphoretic. No erythema. No pallor.  Psychiatric: She has a normal mood and affect. Her behavior is normal. Judgment and thought content normal.          Assessment & Plan:

## 2013-04-02 NOTE — Assessment & Plan Note (Signed)
Recent INR therapeutic. Goal INR 2-3. Plan to repeat INR in one month.

## 2013-04-02 NOTE — Assessment & Plan Note (Signed)
Will recheck renal function with labs today. 

## 2013-04-02 NOTE — Assessment & Plan Note (Signed)
Blood pressure well-controlled. Based on insurance coverage will need to change from valsartan to losartan. We'll plan to do this January 2013. After making change, will need to have renal function rechecked in one week.

## 2013-04-24 ENCOUNTER — Other Ambulatory Visit (INDEPENDENT_AMBULATORY_CARE_PROVIDER_SITE_OTHER): Payer: Medicare Other

## 2013-04-24 DIAGNOSIS — R252 Cramp and spasm: Secondary | ICD-10-CM

## 2013-04-24 DIAGNOSIS — I10 Essential (primary) hypertension: Secondary | ICD-10-CM

## 2013-04-24 DIAGNOSIS — Z7901 Long term (current) use of anticoagulants: Secondary | ICD-10-CM

## 2013-04-24 LAB — COMPREHENSIVE METABOLIC PANEL
Alkaline Phosphatase: 52 U/L (ref 39–117)
CO2: 29 mEq/L (ref 19–32)
Creatinine, Ser: 1.6 mg/dL — ABNORMAL HIGH (ref 0.4–1.2)
GFR: 32.11 mL/min — ABNORMAL LOW (ref >60.00)
Glucose, Bld: 125 mg/dL — ABNORMAL HIGH (ref 70–99)
Sodium: 138 mEq/L (ref 135–145)
Total Bilirubin: 0.3 mg/dL (ref 0.3–1.2)
Total Protein: 6.7 g/dL (ref 6.0–8.3)

## 2013-04-24 LAB — MICROALBUMIN / CREATININE URINE RATIO
Creatinine,U: 51 mg/dL
Microalb Creat Ratio: 4.7 mg/g (ref 0.0–30.0)
Microalb, Ur: 2.4 mg/dL — ABNORMAL HIGH (ref 0.0–1.9)

## 2013-04-24 LAB — PROTIME-INR
INR: 1.8 ratio — ABNORMAL HIGH (ref 0.8–1.0)
Prothrombin Time: 19 s — ABNORMAL HIGH (ref 10.2–12.4)

## 2013-04-24 LAB — MAGNESIUM: Magnesium: 2.3 mg/dL (ref 1.5–2.5)

## 2013-04-29 ENCOUNTER — Telehealth: Payer: Self-pay | Admitting: Internal Medicine

## 2013-04-29 NOTE — Telephone Encounter (Signed)
Spoke with patient and informed her to take 4 mg daily then come back next week for labs. Per patient she already has 4 mg tablets at home for her to take.

## 2013-04-29 NOTE — Telephone Encounter (Signed)
Can you ask her to increase to Coumadin 4mg  daily and repeat INR in 1 week.

## 2013-05-13 ENCOUNTER — Other Ambulatory Visit (INDEPENDENT_AMBULATORY_CARE_PROVIDER_SITE_OTHER): Payer: Medicare Other

## 2013-05-13 DIAGNOSIS — Z7901 Long term (current) use of anticoagulants: Secondary | ICD-10-CM

## 2013-05-13 LAB — PROTIME-INR
INR: 2.8 ratio — ABNORMAL HIGH (ref 0.8–1.0)
Prothrombin Time: 29.3 s — ABNORMAL HIGH (ref 10.2–12.4)

## 2013-06-05 ENCOUNTER — Telehealth: Payer: Self-pay | Admitting: Internal Medicine

## 2013-06-05 NOTE — Telephone Encounter (Signed)
Received a note from her cardiologist recommending PT/INR weekly x4. Please make sure pt is aware and lab appointment scheduled. Need to fax results to [phone].

## 2013-06-08 NOTE — Telephone Encounter (Signed)
Left message to call back  

## 2013-06-08 NOTE — Telephone Encounter (Signed)
Spoke with patient and informed her of plan, she verbalized understanding. Requested daughter's number so I may call her since she left a voicemail last week requesting a call.

## 2013-06-09 NOTE — Telephone Encounter (Signed)
Spoke with daughter Harriett Sineancy, she stated she went to see her cardiologist and they told her she has been in Afib for awhile now. They were trying to shock her heart back in rhythm but they could not do it so they put her on some new medication (blood thinner). Informed her patient has already been scheduled for lab and Dr.Walker did receive the notes from the cardiologist.

## 2013-06-11 ENCOUNTER — Other Ambulatory Visit (INDEPENDENT_AMBULATORY_CARE_PROVIDER_SITE_OTHER): Payer: Medicare Other

## 2013-06-11 DIAGNOSIS — Z7901 Long term (current) use of anticoagulants: Secondary | ICD-10-CM

## 2013-06-11 LAB — PROTIME-INR
INR: 3.3 ratio — ABNORMAL HIGH (ref 0.8–1.0)
PROTHROMBIN TIME: 34.3 s — AB (ref 10.2–12.4)

## 2013-06-12 ENCOUNTER — Telehealth: Payer: Self-pay | Admitting: Internal Medicine

## 2013-06-12 NOTE — Telephone Encounter (Signed)
Ms. Beverly Powell wants [name] to give her a call.

## 2013-06-12 NOTE — Telephone Encounter (Signed)
Spoke with Mrs. Joanne GavelSutton, informed her of patient's med change per lab results. She agreed to adjust coumadin accordingly. For further information please refer to lab results. [facility].

## 2013-06-18 ENCOUNTER — Other Ambulatory Visit (INDEPENDENT_AMBULATORY_CARE_PROVIDER_SITE_OTHER): Payer: Medicare Other

## 2013-06-18 DIAGNOSIS — Z7901 Long term (current) use of anticoagulants: Secondary | ICD-10-CM

## 2013-06-18 LAB — PROTIME-INR
INR: 2 ratio — ABNORMAL HIGH (ref 0.8–1.0)
Prothrombin Time: 21.2 s — ABNORMAL HIGH (ref 10.2–12.4)

## 2013-07-14 ENCOUNTER — Ambulatory Visit: Payer: Medicare Other | Admitting: Internal Medicine

## 2014-03-12 ENCOUNTER — Other Ambulatory Visit: Payer: Self-pay | Admitting: Emergency Medicine

## 2014-03-28 DEATH — deceased

## 2016-04-29 IMAGING — CR DG HIP COMPLETE 2+V*L*
1 series · 3 of 3 positions shown · non-contrast
Comparison: None.

CLINICAL DATA: Left hip pain.  No injury.

EXAM:
LEFT HIP - COMPLETE 2+ VIEW

[Series 2: t pelvis ap · 0.14mm/px · 3 of 3 slices shown]
[im 1/3]
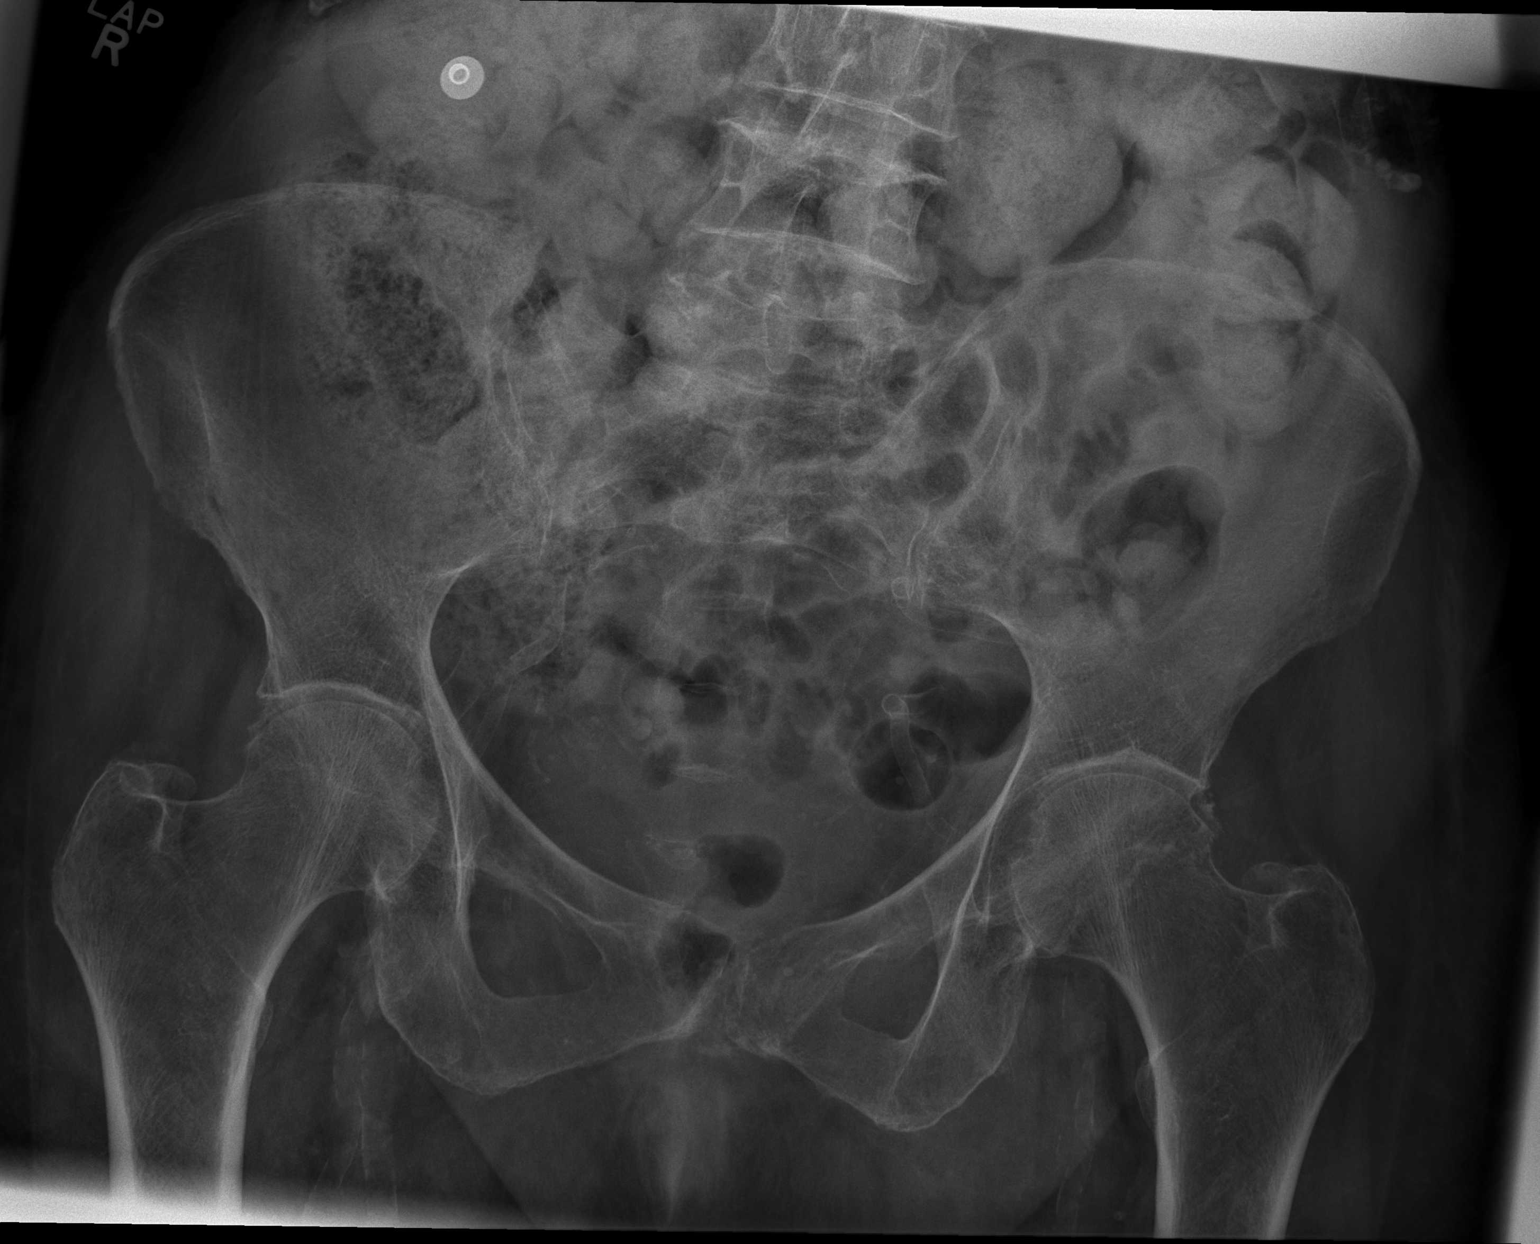
[im 2/3]
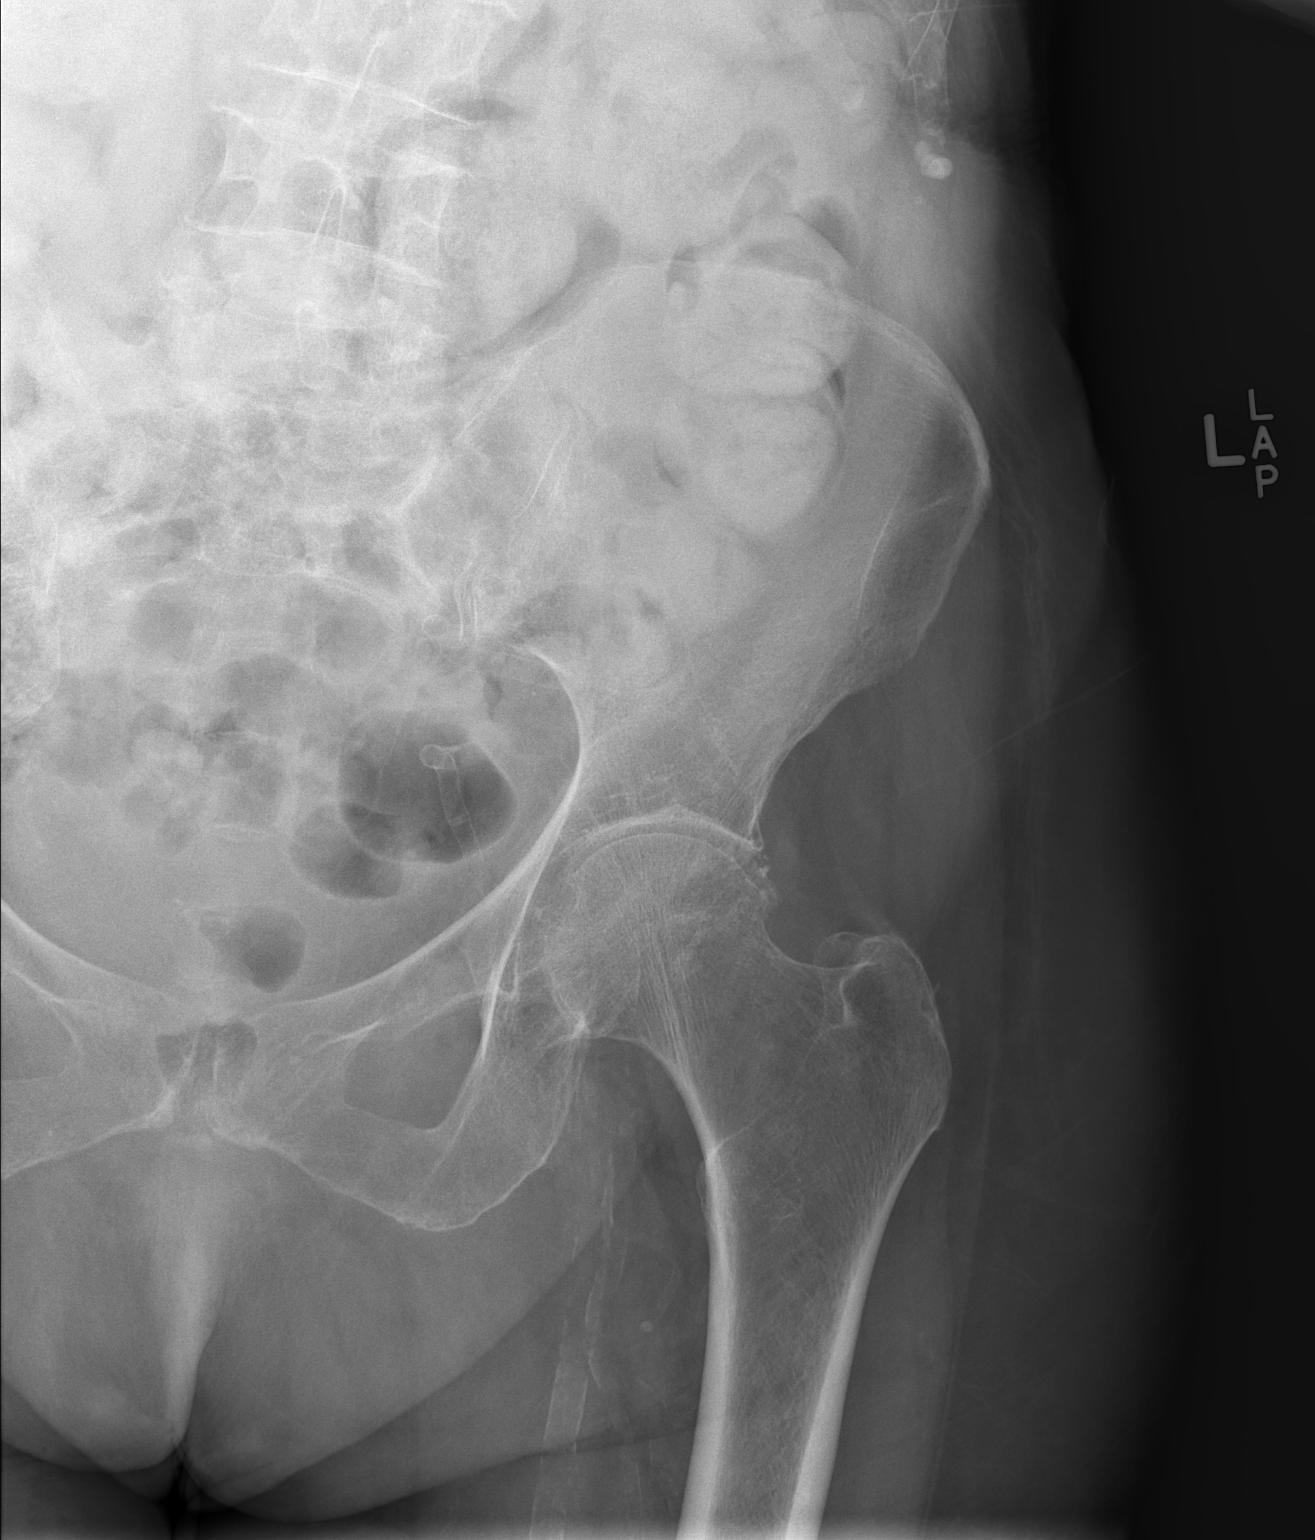
[im 3/3]
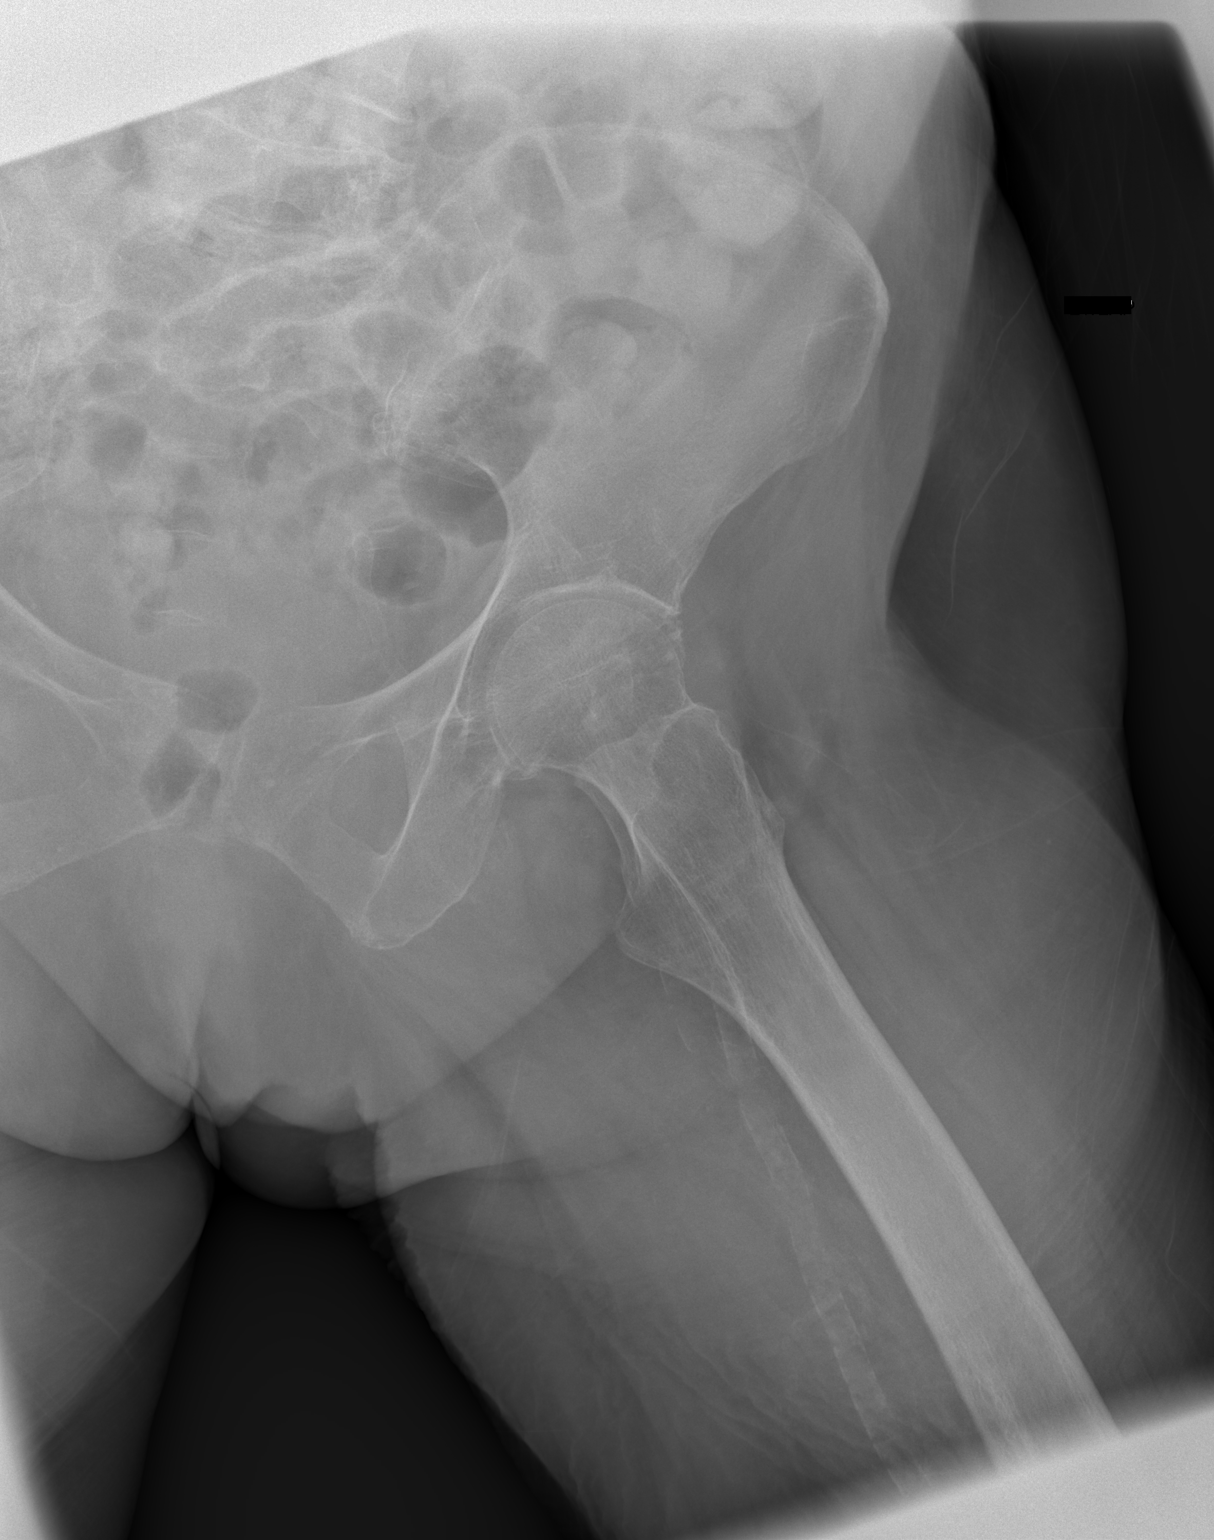

[3 of 3 positions shown; findings below may reference images not displayed]

FINDINGS: There is diffuse decreased bone mineralization. There is mild
bilateral symmetric degenerative change of the hips. There is no
acute fracture dislocation. There are degenerative changes of the
spine. There appears to be mild loss of height of the L4 vertebral
body.
IMPRESSION: No acute findings concerning the left hip.

Mild symmetric degenerative change of the hips.

Mild decreased height of the L4 vertebral body which may represent
mild compression deformity.

## 2016-09-07 IMAGING — CR DG CHEST 1V PORT
1 series · 1 of 1 positions shown · non-contrast
Comparison: 04/26/2010

CLINICAL DATA: Mental status changes. Labored breathing. Fever. Or
fibrillation.

EXAM:
PORTABLE CHEST - 1 VIEW

[ap]
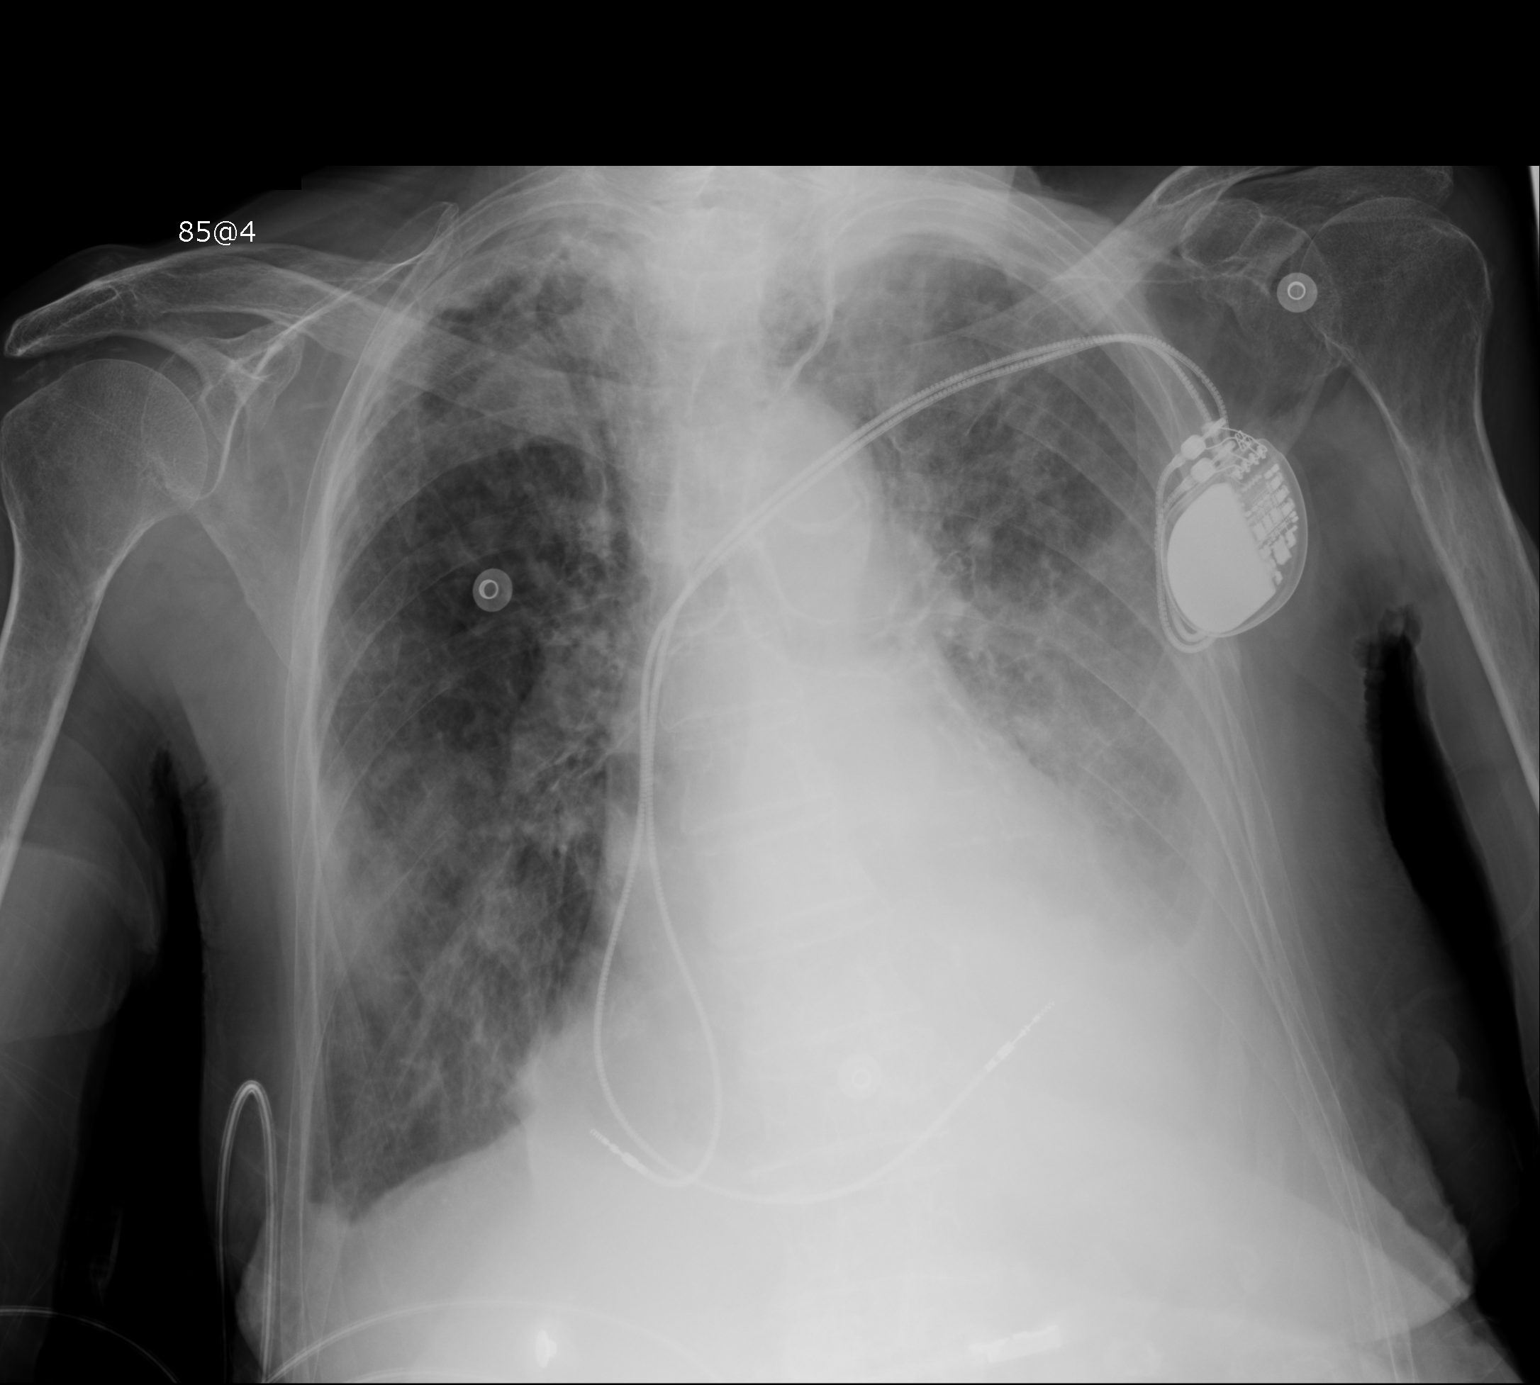

[1 of 1 positions shown; findings below may reference images not displayed]

FINDINGS: Dual lead pacemaker remains in place, grossly unchanged. There is
chronic cardiomegaly with atherosclerosis of the aorta. There is
diffuse pulmonary density most consistent with pulmonary edema.
There is a MK moderate size pleural effusion on the left with
collapse of left lower lung. Pneumonia could coexistent this
appearance. No acute bony finding.
IMPRESSION: Congestive heart failure with diffuse edema end a left effusion with
associated left lower lobe volume loss. Pneumonia is not excluded
based on this appearance.
